# Patient Record
Sex: Male | Born: 1945 | Race: White | Hispanic: No | Marital: Married | Smoking: Never smoker
Health system: Southern US, Community
[De-identification: ages and names within clinical notes are randomized; demographics above are authoritative.]

## PROBLEM LIST (undated history)

## (undated) DIAGNOSIS — M109 Gout, unspecified: Secondary | ICD-10-CM

## (undated) DIAGNOSIS — F4310 Post-traumatic stress disorder, unspecified: Secondary | ICD-10-CM

## (undated) DIAGNOSIS — F419 Anxiety disorder, unspecified: Secondary | ICD-10-CM

## (undated) DIAGNOSIS — K219 Gastro-esophageal reflux disease without esophagitis: Secondary | ICD-10-CM

## (undated) DIAGNOSIS — F410 Panic disorder [episodic paroxysmal anxiety] without agoraphobia: Secondary | ICD-10-CM

## (undated) DIAGNOSIS — F32A Depression, unspecified: Secondary | ICD-10-CM

## (undated) DIAGNOSIS — IMO0001 Reserved for inherently not codable concepts without codable children: Secondary | ICD-10-CM

## (undated) DIAGNOSIS — F329 Major depressive disorder, single episode, unspecified: Secondary | ICD-10-CM

## (undated) HISTORY — PX: TONSILLECTOMY AND ADENOIDECTOMY: SHX28

## (undated) HISTORY — DX: Major depressive disorder, single episode, unspecified: F32.9

## (undated) HISTORY — PX: APPENDECTOMY: SHX54

## (undated) HISTORY — DX: Post-traumatic stress disorder, unspecified: F43.10

## (undated) HISTORY — PX: HERNIA REPAIR: SHX51

## (undated) HISTORY — PX: OTHER SURGICAL HISTORY: SHX169

## (undated) HISTORY — DX: Gastro-esophageal reflux disease without esophagitis: K21.9

## (undated) HISTORY — DX: Anxiety disorder, unspecified: F41.9

## (undated) HISTORY — DX: Depression, unspecified: F32.A

## (undated) HISTORY — DX: Reserved for inherently not codable concepts without codable children: IMO0001

## (undated) HISTORY — PX: VASECTOMY: SHX75

## (undated) HISTORY — PX: NOSE SURGERY: SHX723

## (undated) HISTORY — DX: Panic disorder (episodic paroxysmal anxiety): F41.0

---

## 1967-05-20 HISTORY — PX: OTHER SURGICAL HISTORY: SHX169

## 1999-08-31 ENCOUNTER — Encounter: Payer: Self-pay | Admitting: Emergency Medicine

## 1999-08-31 ENCOUNTER — Emergency Department (HOSPITAL_COMMUNITY): Admission: EM | Admit: 1999-08-31 | Discharge: 1999-08-31 | Payer: Self-pay | Admitting: Emergency Medicine

## 2002-05-24 ENCOUNTER — Emergency Department (HOSPITAL_COMMUNITY): Admission: EM | Admit: 2002-05-24 | Discharge: 2002-05-24 | Payer: Self-pay | Admitting: Emergency Medicine

## 2005-03-19 ENCOUNTER — Ambulatory Visit (HOSPITAL_COMMUNITY): Admission: RE | Admit: 2005-03-19 | Discharge: 2005-03-19 | Payer: Self-pay | Admitting: Family Medicine

## 2005-04-11 ENCOUNTER — Ambulatory Visit (HOSPITAL_COMMUNITY): Admission: RE | Admit: 2005-04-11 | Discharge: 2005-04-11 | Payer: Self-pay | Admitting: Family Medicine

## 2005-05-23 ENCOUNTER — Ambulatory Visit (HOSPITAL_COMMUNITY): Admission: RE | Admit: 2005-05-23 | Discharge: 2005-05-23 | Payer: Self-pay | Admitting: Family Medicine

## 2007-02-08 ENCOUNTER — Ambulatory Visit (HOSPITAL_COMMUNITY): Admission: RE | Admit: 2007-02-08 | Discharge: 2007-02-08 | Payer: Self-pay | Admitting: Family Medicine

## 2007-10-27 ENCOUNTER — Ambulatory Visit (HOSPITAL_COMMUNITY): Admission: RE | Admit: 2007-10-27 | Discharge: 2007-10-27 | Payer: Self-pay | Admitting: Family Medicine

## 2008-01-18 ENCOUNTER — Ambulatory Visit (HOSPITAL_COMMUNITY): Admission: RE | Admit: 2008-01-18 | Discharge: 2008-01-18 | Payer: Self-pay | Admitting: Family Medicine

## 2008-01-31 ENCOUNTER — Emergency Department (HOSPITAL_COMMUNITY): Admission: EM | Admit: 2008-01-31 | Discharge: 2008-01-31 | Payer: Self-pay | Admitting: Emergency Medicine

## 2008-02-17 ENCOUNTER — Ambulatory Visit (HOSPITAL_COMMUNITY): Admission: RE | Admit: 2008-02-17 | Discharge: 2008-02-17 | Payer: Self-pay | Admitting: Family Medicine

## 2008-03-16 ENCOUNTER — Ambulatory Visit (HOSPITAL_COMMUNITY): Admission: RE | Admit: 2008-03-16 | Discharge: 2008-03-16 | Payer: Self-pay | Admitting: Internal Medicine

## 2008-06-20 ENCOUNTER — Emergency Department (HOSPITAL_COMMUNITY): Admission: EM | Admit: 2008-06-20 | Discharge: 2008-06-20 | Payer: Self-pay | Admitting: Emergency Medicine

## 2009-05-03 ENCOUNTER — Encounter: Payer: Self-pay | Admitting: Orthopedic Surgery

## 2009-05-03 ENCOUNTER — Ambulatory Visit (HOSPITAL_COMMUNITY): Admission: RE | Admit: 2009-05-03 | Discharge: 2009-05-03 | Payer: Self-pay | Admitting: Family Medicine

## 2009-05-17 ENCOUNTER — Ambulatory Visit: Payer: Self-pay | Admitting: Orthopedic Surgery

## 2009-05-17 DIAGNOSIS — S62639A Displaced fracture of distal phalanx of unspecified finger, initial encounter for closed fracture: Secondary | ICD-10-CM

## 2009-06-14 ENCOUNTER — Ambulatory Visit: Payer: Self-pay | Admitting: Orthopedic Surgery

## 2009-12-25 ENCOUNTER — Ambulatory Visit (HOSPITAL_COMMUNITY): Admission: RE | Admit: 2009-12-25 | Discharge: 2009-12-25 | Payer: Self-pay | Admitting: Internal Medicine

## 2010-03-28 ENCOUNTER — Ambulatory Visit: Payer: Self-pay | Admitting: Orthopedic Surgery

## 2010-03-28 DIAGNOSIS — M1710 Unilateral primary osteoarthritis, unspecified knee: Secondary | ICD-10-CM

## 2010-03-28 DIAGNOSIS — M25469 Effusion, unspecified knee: Secondary | ICD-10-CM | POA: Insufficient documentation

## 2010-06-17 ENCOUNTER — Emergency Department (HOSPITAL_COMMUNITY)
Admission: EM | Admit: 2010-06-17 | Discharge: 2010-06-17 | Payer: Self-pay | Source: Home / Self Care | Admitting: Emergency Medicine

## 2010-06-17 ENCOUNTER — Encounter: Payer: Self-pay | Admitting: Orthopedic Surgery

## 2010-06-18 ENCOUNTER — Emergency Department (HOSPITAL_COMMUNITY)
Admission: EM | Admit: 2010-06-18 | Discharge: 2010-06-18 | Payer: Self-pay | Source: Home / Self Care | Admitting: Emergency Medicine

## 2010-06-18 NOTE — Assessment & Plan Note (Signed)
Summary: 4 WK RE-CK/XRAY THUMB/MAILHANDL/CAF   Visit Type:  Follow-up Referring Provider:  Dr. Tawanna Cooler so Primary Provider:  Dr. presents  CC:  left thumb fracture.  History of Present Illness: fracture care   65 year old male LEFT thumb fracture distal phalanx comminuted followup for x-rays  Doing well has returned to full motion.  He has some subungual old blood which will resolve  X-rays show comminuted fracture and stable alignment  Impression improved followup as needed Meds: Citalopram, Clonazepam, Omeprazole, Ibuprofen, Trazadone, Neurontin.  Allergies: No Known Drug Allergies   Impression & Recommendations:  Problem # 1:  CLOSED FRACTURE DISTAL PHALANX OR PHALANGES HAND (ICD-816.02) Assessment Improved  x-ray [facility] lateral oblique distal phalanx LEFT thumb  Findings comminuted fracture with normal alignment  Impression healing fracture normal alignment most likely was a fibrous union  Orders: Post-Op Check (16109) Finger(s) x-ray, minimum 2 views (60454)  Patient Instructions: 1)  Please schedule a follow-up appointment as needed.
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
## 2010-06-18 NOTE — Letter (Signed)
Summary: History form  History form   Imported By: Jacklynn Ganong 05/25/2009 08:54:24  _____________________________________________________________________  External Attachment:    Type:   Image     Comment:   External Document

## 2010-06-20 ENCOUNTER — Encounter: Payer: Self-pay | Admitting: Orthopedic Surgery

## 2010-06-20 ENCOUNTER — Ambulatory Visit (INDEPENDENT_AMBULATORY_CARE_PROVIDER_SITE_OTHER): Payer: No Typology Code available for payment source | Admitting: Orthopedic Surgery

## 2010-06-20 DIAGNOSIS — S61209A Unspecified open wound of unspecified finger without damage to nail, initial encounter: Secondary | ICD-10-CM

## 2010-06-21 ENCOUNTER — Ambulatory Visit (HOSPITAL_COMMUNITY)
Admission: RE | Admit: 2010-06-21 | Discharge: 2010-06-21 | Disposition: A | Discharge status: Home / Self Care | Payer: No Typology Code available for payment source | Source: Ambulatory Visit | Attending: Family Medicine | Admitting: Family Medicine

## 2010-06-21 ENCOUNTER — Telehealth: Payer: Self-pay | Admitting: Orthopedic Surgery

## 2010-06-21 ENCOUNTER — Ambulatory Visit (HOSPITAL_COMMUNITY): Payer: Self-pay | Admitting: Physical Therapy

## 2010-06-21 DIAGNOSIS — S61209A Unspecified open wound of unspecified finger without damage to nail, initial encounter: Secondary | ICD-10-CM | POA: Insufficient documentation

## 2010-06-21 DIAGNOSIS — IMO0001 Reserved for inherently not codable concepts without codable children: Secondary | ICD-10-CM | POA: Insufficient documentation

## 2010-06-21 NOTE — Letter (Signed)
Summary: History form  History form   Imported By: Jacklynn Ganong 03/28/2010 15:18:59  _____________________________________________________________________  External Attachment:    Type:   Image     Comment:   External Document

## 2010-06-24 ENCOUNTER — Ambulatory Visit (HOSPITAL_COMMUNITY)
Admission: RE | Admit: 2010-06-24 | Discharge: 2010-06-24 | Disposition: A | Discharge status: Home / Self Care | Payer: No Typology Code available for payment source | Source: Ambulatory Visit | Attending: Family Medicine | Admitting: Family Medicine

## 2010-06-24 DIAGNOSIS — S62639A Displaced fracture of distal phalanx of unspecified finger, initial encounter for closed fracture: Secondary | ICD-10-CM | POA: Insufficient documentation

## 2010-06-26 ENCOUNTER — Ambulatory Visit (HOSPITAL_COMMUNITY)
Admission: RE | Admit: 2010-06-26 | Discharge: 2010-06-26 | Disposition: A | Discharge status: Home / Self Care | Payer: No Typology Code available for payment source | Source: Ambulatory Visit | Attending: Family Medicine | Admitting: Family Medicine

## 2010-06-26 DIAGNOSIS — S61209A Unspecified open wound of unspecified finger without damage to nail, initial encounter: Secondary | ICD-10-CM | POA: Insufficient documentation

## 2010-06-26 DIAGNOSIS — IMO0001 Reserved for inherently not codable concepts without codable children: Secondary | ICD-10-CM | POA: Insufficient documentation
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
## 2010-06-26 NOTE — Miscellaneous (Signed)
  Clinical Lists Changes  Medications: Added new medication of PERCOCET 7.5-325 MG TABS (OXYCODONE-ACETAMINOPHEN) 1 q 4 - Signed Rx of PERCOCET 7.5-325 MG TABS (OXYCODONE-ACETAMINOPHEN) 1 q 4;  #42 x 0;  Signed;  Entered by: Fuller Canada MD;  Authorized by: Fuller Canada MD;  Method used: Handwritten    Prescriptions: PERCOCET 7.5-325 MG TABS (OXYCODONE-ACETAMINOPHEN) 1 q 4  #42 x 0   Entered and Authorized by:   Fuller Canada MD   Signed by:   Fuller Canada MD on 06/20/2010   Method used:   Handwritten   RxID:   [id number]

## 2010-06-27 ENCOUNTER — Encounter: Payer: Self-pay | Admitting: Orthopedic Surgery

## 2010-06-27 ENCOUNTER — Ambulatory Visit (INDEPENDENT_AMBULATORY_CARE_PROVIDER_SITE_OTHER): Payer: No Typology Code available for payment source | Admitting: Orthopedic Surgery

## 2010-06-27 DIAGNOSIS — S61209A Unspecified open wound of unspecified finger without damage to nail, initial encounter: Secondary | ICD-10-CM

## 2010-06-28 ENCOUNTER — Ambulatory Visit (HOSPITAL_COMMUNITY)
Admission: RE | Admit: 2010-06-28 | Discharge: 2010-06-28 | Disposition: A | Discharge status: Home / Self Care | Payer: No Typology Code available for payment source | Source: Ambulatory Visit | Attending: Physical Therapy | Admitting: Physical Therapy

## 2010-07-01 ENCOUNTER — Ambulatory Visit (HOSPITAL_COMMUNITY)
Admission: RE | Admit: 2010-07-01 | Discharge: 2010-07-01 | Disposition: A | Discharge status: Home / Self Care | Payer: No Typology Code available for payment source | Source: Ambulatory Visit | Attending: Physical Therapy | Admitting: Physical Therapy

## 2010-07-03 ENCOUNTER — Ambulatory Visit (HOSPITAL_COMMUNITY)
Admission: RE | Admit: 2010-07-03 | Discharge: 2010-07-03 | Disposition: A | Discharge status: Home / Self Care | Payer: No Typology Code available for payment source | Source: Ambulatory Visit | Attending: *Deleted | Admitting: *Deleted

## 2010-07-04 NOTE — Assessment & Plan Note (Signed)
Summary: 1 wk reck finger/mailhandlers/bsf   Visit Type:  Follow-up Referring Provider:  self Primary Provider:  [name]  CC:  recheck finger.  History of Present Illness: I saw Jesse Yu in the office today for a new problem visit.  He is a 65 years old man with the complaint of:  right middle finger injury.  06/17/10 DOI caught finger in a pulley.  06/17/10 finger xray [facility].  Meds: Clonazepam, Trazodone, Omeprazole, Citalopram, Niacin, ASA, Ibuprofen 800mg   Percocet 7.5 from our office and Doxycycline 100mg  two times a day x 10 days given from er.  Today is 1 week recheck on finger after wound care every other day.  Doing better.  Pain level today is 6 with medication.        Allergies: No Known Drug Allergies  Physical Exam  Additional Exam:  clean wound bed  redressed    Other Orders: Est. Patient Level I (16109)  Patient Instructions: 1)  Return in 2 week  2)  continue dressing changes  Prescriptions: PERCOCET 7.5-325 MG TABS (OXYCODONE-ACETAMINOPHEN) 1 q 4  #84 x 0   Entered and Authorized by:   Fuller Canada MD   Signed by:   Fuller Canada MD on 06/27/2010   Method used:   Print then Give to Patient   RxID:   [id number]    Orders Added: 1)  Est. Patient Level I [95621]

## 2010-07-04 NOTE — Progress Notes (Signed)
Summary: physical therapy order verificiation about splint  Phone Note Call from Patient   Caller: Patient Summary of Call: Donnamae Jude from physical therapy, ph [phone], called fol'g seeing patient today. Verifying that Dr Romeo Apple did not intend for them to make a new splint. Order states "wrap and splinting"; just making sure.  Patient's next visit at therapy is Mon, 06/24/10 at 8:00am  Initial call taken by: Cammie Sickle,  June 21, 2010 11:58 AM

## 2010-07-05 ENCOUNTER — Ambulatory Visit (HOSPITAL_COMMUNITY)
Admission: RE | Admit: 2010-07-05 | Discharge: 2010-07-05 | Disposition: A | Discharge status: Home / Self Care | Payer: No Typology Code available for payment source | Source: Ambulatory Visit | Attending: *Deleted | Admitting: *Deleted

## 2010-07-08 ENCOUNTER — Ambulatory Visit (HOSPITAL_COMMUNITY)
Admission: RE | Admit: 2010-07-08 | Discharge: 2010-07-08 | Disposition: A | Discharge status: Home / Self Care | Payer: No Typology Code available for payment source | Source: Ambulatory Visit | Attending: *Deleted | Admitting: *Deleted

## 2010-07-10 ENCOUNTER — Ambulatory Visit (HOSPITAL_COMMUNITY)
Admission: RE | Admit: 2010-07-10 | Discharge: 2010-07-10 | Disposition: A | Discharge status: Home / Self Care | Payer: No Typology Code available for payment source | Source: Ambulatory Visit | Attending: *Deleted | Admitting: *Deleted

## 2010-07-10 NOTE — Miscellaneous (Signed)
Summary: PT/Wound evaluation  PT/Wound evaluation   Imported By: Jacklynn Ganong 07/03/2010 12:21:50  _____________________________________________________________________  External Attachment:    Type:   Image     Comment:   External Document

## 2010-07-11 ENCOUNTER — Encounter: Payer: Self-pay | Admitting: Orthopedic Surgery

## 2010-07-11 ENCOUNTER — Ambulatory Visit (INDEPENDENT_AMBULATORY_CARE_PROVIDER_SITE_OTHER): Payer: No Typology Code available for payment source | Admitting: Orthopedic Surgery

## 2010-07-11 DIAGNOSIS — S61209A Unspecified open wound of unspecified finger without damage to nail, initial encounter: Secondary | ICD-10-CM

## 2010-07-12 ENCOUNTER — Ambulatory Visit (HOSPITAL_COMMUNITY)
Admission: RE | Admit: 2010-07-12 | Discharge: 2010-07-12 | Disposition: A | Discharge status: Home / Self Care | Payer: No Typology Code available for payment source | Source: Ambulatory Visit | Attending: *Deleted | Admitting: *Deleted

## 2010-07-15 ENCOUNTER — Ambulatory Visit (HOSPITAL_COMMUNITY)
Admission: RE | Admit: 2010-07-15 | Discharge: 2010-07-15 | Disposition: A | Discharge status: Home / Self Care | Payer: No Typology Code available for payment source | Source: Ambulatory Visit | Attending: *Deleted | Admitting: *Deleted

## 2010-07-16 NOTE — Letter (Signed)
Summary: History form  History form   Imported By: Jacklynn Ganong 07/09/2010 14:33:26  _____________________________________________________________________  External Attachment:    Type:   Image     Comment:   External Document

## 2010-07-16 NOTE — Assessment & Plan Note (Signed)
Summary: 2 wk RE-CK/DRESSING/RT MIDDLE FINGER/Mailhandlers/CAF   Visit Type:  Follow-up Referring Provider:  self Primary Provider:  [name]  CC:  recheck finger dressing change.  History of Present Illness: I saw Jesse Yu in the office today for a new problem visit.  He is a 65 years old man with the complaint of:  right middle finger injury.  06/17/10 DOI caught finger in a pulley. 06/17/10 finger xray [facility].  Meds: Clonazepam, Trazodone, Omeprazole, Citalopram, Niacin, ASA, Ibuprofen 800mg  Percocet 7.5 from our office for pain, needs refill.  Today is 2 week recheck on finger after wound care every other day.  Pain level is 0, is anxious.           Allergies: No Known Drug Allergies  Physical Exam  Additional Exam:  RIGHT long finger.  The finger looks good. The wound is closing has a little bit of a flexion contracture   Impression & Recommendations:  Problem # 1:  OPEN WOUND FINGER WITHOUT MENTION COMPLICATION (ICD-883.0) Assessment Improved  Orders: Est. Patient Level I (16109)  Medications Added to Medication List This Visit: 1)  Norco 10-325 Mg Tabs (Hydrocodone-acetaminophen) .Marland Kitchen.. 1 by mouth q 4 prn pain  Patient Instructions: 1)  4 weeks  Prescriptions: NORCO 10-325 MG TABS (HYDROCODONE-ACETAMINOPHEN) 1 by mouth q 4 prn pain  #84 x 1   Entered and Authorized by:   Fuller Canada MD   Signed by:   Fuller Canada MD on 07/11/2010   Method used:   Print then Give to Patient   RxID:   [id number]    Orders Added: 1)  Est. Patient Level I [95621]

## 2010-07-17 ENCOUNTER — Ambulatory Visit (HOSPITAL_COMMUNITY)
Admission: RE | Admit: 2010-07-17 | Discharge: 2010-07-17 | Disposition: A | Discharge status: Home / Self Care | Payer: No Typology Code available for payment source | Source: Ambulatory Visit | Attending: *Deleted | Admitting: *Deleted

## 2010-07-19 ENCOUNTER — Encounter: Payer: Self-pay | Admitting: Orthopedic Surgery

## 2010-07-19 ENCOUNTER — Ambulatory Visit (HOSPITAL_COMMUNITY)
Admission: RE | Admit: 2010-07-19 | Discharge: 2010-07-19 | Disposition: A | Discharge status: Home / Self Care | Payer: No Typology Code available for payment source | Source: Ambulatory Visit | Attending: Family Medicine | Admitting: Family Medicine

## 2010-07-19 ENCOUNTER — Telehealth: Payer: Self-pay | Admitting: Orthopedic Surgery

## 2010-07-19 DIAGNOSIS — S62639A Displaced fracture of distal phalanx of unspecified finger, initial encounter for closed fracture: Secondary | ICD-10-CM | POA: Insufficient documentation

## 2010-07-22 ENCOUNTER — Ambulatory Visit (HOSPITAL_COMMUNITY): Payer: Self-pay | Admitting: Physical Therapy

## 2010-07-24 ENCOUNTER — Ambulatory Visit (HOSPITAL_COMMUNITY): Payer: Self-pay | Admitting: Physical Therapy

## 2010-07-25 NOTE — Miscellaneous (Signed)
Summary: PT/Wound care order  PT/Wound care order   Imported By: Cammie Sickle 07/16/2010 11:37:42  _____________________________________________________________________  External Attachment:    Type:   Image     Comment:   External Document

## 2010-07-26 ENCOUNTER — Ambulatory Visit (HOSPITAL_COMMUNITY): Payer: Self-pay | Admitting: Physical Therapy

## 2010-07-29 ENCOUNTER — Encounter: Payer: Self-pay | Admitting: Orthopedic Surgery

## 2010-07-29 ENCOUNTER — Ambulatory Visit (HOSPITAL_COMMUNITY): Payer: Self-pay | Admitting: Physical Therapy

## 2010-07-30 NOTE — Progress Notes (Signed)
Summary: patient question fol'g completing wound care treatment  Phone Note Call from Patient   Caller: Patient Summary of Call: Patient stopped in this morning to relay that he has "finished" his wound care at [facility].  He requested to cancel his upcoming fol/up appointment.  I have advised to keep the appointment in our office as scheduled.  Report not yet received from [facility]    If any other recommendations, please advise. Initial call taken by: Cammie Sickle,  July 19, 2010 11:49 AM

## 2010-07-31 ENCOUNTER — Ambulatory Visit (HOSPITAL_COMMUNITY): Payer: Self-pay | Admitting: Physical Therapy

## 2010-08-02 ENCOUNTER — Ambulatory Visit (HOSPITAL_COMMUNITY): Payer: Self-pay | Admitting: Physical Therapy

## 2010-08-05 ENCOUNTER — Encounter: Payer: Self-pay | Admitting: Orthopedic Surgery

## 2010-08-08 ENCOUNTER — Ambulatory Visit: Payer: Self-pay | Admitting: Orthopedic Surgery

## 2010-12-12 ENCOUNTER — Encounter: Payer: Self-pay | Admitting: Orthopedic Surgery

## 2010-12-12 ENCOUNTER — Ambulatory Visit (INDEPENDENT_AMBULATORY_CARE_PROVIDER_SITE_OTHER): Payer: No Typology Code available for payment source | Admitting: Orthopedic Surgery

## 2010-12-12 DIAGNOSIS — M25539 Pain in unspecified wrist: Secondary | ICD-10-CM

## 2010-12-12 NOTE — Progress Notes (Signed)
Chief complaint wrist pain bilateral  65 year old male  Bilateral wrist pain after putting up a fax and doing some shoveling.  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  When he is not doing this no pain.  Previous to carpal tunnel releases done elsewhere  Review of systems no numbness or tingling related to this he has some anxiety and depression he denies shortness of breath heartburn or frequency  Vital signs are stable as recorded  General appearance is normal  The patient is alert and oriented x3  The patient's mood and affect are normal  Gait assessment: Normal The cardiovascular exam reveals normal pulses and temperature without edema swelling.  The lymphatic system is negative for palpable lymph nodes  The sensory exam is normal.  There are no pathologic reflexes.  Balance is normal.   Exam of the Both wrists Inspection No tenderness no swelling, range of motion normal.  Excellent grip strength.  Watson click test negative.  Skin intact.  Normal.   Wrist pain secondary to stress and increase activity  Recommended wrist sleeves or avoid the offending activity  Patient comfortable with this patient

## 2010-12-12 NOTE — Patient Instructions (Signed)
Get wrist sleeve and use with working or when pain flares up.  Come back as needed

## 2011-01-03 ENCOUNTER — Inpatient Hospital Stay (HOSPITAL_COMMUNITY)
Admission: EM | Admit: 2011-01-03 | Discharge: 2011-01-04 | Disposition: A | Discharge status: Other Acute Inpatient Hospital | Payer: No Typology Code available for payment source | Source: Home / Self Care

## 2011-01-03 ENCOUNTER — Encounter (HOSPITAL_COMMUNITY): Payer: Self-pay | Admitting: Emergency Medicine

## 2011-01-03 DIAGNOSIS — W5911XA Bitten by nonvenomous snake, initial encounter: Secondary | ICD-10-CM

## 2011-01-03 DIAGNOSIS — M109 Gout, unspecified: Secondary | ICD-10-CM | POA: Diagnosis present

## 2011-01-03 DIAGNOSIS — M25539 Pain in unspecified wrist: Secondary | ICD-10-CM | POA: Diagnosis present

## 2011-01-03 DIAGNOSIS — R51 Headache: Secondary | ICD-10-CM | POA: Diagnosis present

## 2011-01-03 DIAGNOSIS — T6391XA Toxic effect of contact with unspecified venomous animal, accidental (unintentional), initial encounter: Principal | ICD-10-CM | POA: Diagnosis present

## 2011-01-03 DIAGNOSIS — T63001A Toxic effect of unspecified snake venom, accidental (unintentional), initial encounter: Secondary | ICD-10-CM | POA: Diagnosis present

## 2011-01-03 DIAGNOSIS — F341 Dysthymic disorder: Secondary | ICD-10-CM | POA: Diagnosis present

## 2011-01-03 DIAGNOSIS — F4310 Post-traumatic stress disorder, unspecified: Secondary | ICD-10-CM | POA: Diagnosis present

## 2011-01-03 LAB — COMPREHENSIVE METABOLIC PANEL
ALT: 22 U/L (ref 0–53)
Albumin: 3.7 g/dL (ref 3.5–5.2)
BUN: 16 mg/dL (ref 6–23)
Chloride: 102 mEq/L (ref 96–112)
Creatinine, Ser: 1.02 mg/dL (ref 0.50–1.35)
GFR calc non Af Amer: >60 mL/min (ref >60)
Glucose, Bld: 111 mg/dL — ABNORMAL HIGH (ref 70–99)
Sodium: 137 mEq/L (ref 135–145)
Total Bilirubin: 0.2 mg/dL — ABNORMAL LOW (ref 0.3–1.2)

## 2011-01-03 LAB — CBC
HCT: 42.9 % (ref 39.0–52.0)
Hemoglobin: 14.7 g/dL (ref 13.0–17.0)
MCH: 31.3 pg (ref 26.0–34.0)
MCHC: 34.3 g/dL (ref 30.0–36.0)
RDW: 14.1 % (ref 11.5–15.5)

## 2011-01-03 LAB — DIFFERENTIAL: Basophils Absolute: 0.1 10*3/uL (ref 0.0–0.1)

## 2011-01-03 LAB — PROTIME-INR: Prothrombin Time: 13 seconds (ref 11.6–15.2)

## 2011-01-03 MED ORDER — SODIUM CHLORIDE 0.9 % IV SOLN
6.0000 | Freq: Once | INTRAVENOUS | Status: AC
  Administered 2011-01-03: 60 mL via INTRAVENOUS
  Filled 2011-01-03: qty 60
  Filled 2011-01-03: qty 40

## 2011-01-03 MED ORDER — ONDANSETRON HCL 4 MG/2ML IJ SOLN
4.0000 mg | Freq: Once | INTRAMUSCULAR | Status: AC
  Administered 2011-01-03: 4 mg via INTRAVENOUS
  Filled 2011-01-03: qty 2

## 2011-01-03 MED ORDER — ONDANSETRON HCL 4 MG/2ML IJ SOLN
INTRAMUSCULAR | Status: AC
  Filled 2011-01-03: qty 2

## 2011-01-03 MED ORDER — SODIUM CHLORIDE 0.9 % IV BOLUS (SEPSIS)
1000.0000 mL | Freq: Once | INTRAVENOUS | Status: AC
  Administered 2011-01-03: 1000 mL via INTRAVENOUS

## 2011-01-03 MED ORDER — HYDROMORPHONE HCL 2 MG/ML IJ SOLN: 2.0000 mg | Freq: Once | INTRAMUSCULAR | Status: DC

## 2011-01-03 MED ORDER — ONDANSETRON HCL 4 MG/2ML IJ SOLN
INTRAMUSCULAR | Status: AC
  Administered 2011-01-03: 4 mg
  Filled 2011-01-03: qty 2

## 2011-01-03 MED ORDER — HYDROMORPHONE HCL 1 MG/ML IJ SOLN
INTRAMUSCULAR | Status: AC
  Filled 2011-01-03: qty 1

## 2011-01-03 MED ORDER — SODIUM CHLORIDE 0.9 % IV SOLN: Freq: Once | INTRAVENOUS | Status: DC

## 2011-01-03 MED ORDER — ONDANSETRON HCL 4 MG/2ML IJ SOLN: 4.0000 mg | Freq: Once | INTRAMUSCULAR | Status: DC

## 2011-01-03 MED ORDER — HYDROMORPHONE HCL 2 MG/ML IJ SOLN
INTRAMUSCULAR | Status: AC
  Administered 2011-01-03: 2 mg
  Filled 2011-01-03: qty 1

## 2011-01-03 MED ORDER — HYDROMORPHONE HCL 1 MG/ML IJ SOLN
1.0000 mg | Freq: Once | INTRAMUSCULAR | Status: AC
  Administered 2011-01-03: 1 mg via INTRAVENOUS
  Filled 2011-01-03: qty 1

## 2011-01-03 MED ORDER — CROTALIDAE POLYVAL IMMUNE FAB IV SOLR
INTRAVENOUS | Status: AC
  Filled 2011-01-03: qty 10

## 2011-01-03 MED ORDER — HYDROMORPHONE HCL 1 MG/ML PO LIQD: 1.5000 mg | ORAL | Status: DC | PRN

## 2011-01-03 MED ORDER — HYDROMORPHONE HCL 4 MG/ML IJ SOLN: 4.0000 mg | Freq: Once | INTRAMUSCULAR | Status: DC

## 2011-01-03 NOTE — ED Notes (Signed)
Pain lt hand and forearm, painful,  Good cap refill

## 2011-01-03 NOTE — ED Notes (Signed)
crofab started at 77ml/hr for first per instructions.

## 2011-01-03 NOTE — ED Notes (Signed)
crofab-each vial mixed with 10ml of sterile water then all 6 vials put in 250NS -started at 610pm. Swelling has now extended to wrist area. Pt still c/o pain. In process of 2nd iv site to give pain/nausea meds. Pt slightly more pale to face than when arrived. Pt only c/o now is still just pain. Was advised upon arrival to hold hand down as low as possible.

## 2011-01-03 NOTE — ED Notes (Signed)
Swelling to elbow LT,  Good cap refill.  No distress. Called report

## 2011-01-03 NOTE — ED Notes (Signed)
Swelling present.  Good cap refilll

## 2011-01-03 NOTE — ED Notes (Signed)
Pt bit by [place] copper head snake, has snake head with him. Pt is alert/oriented. Pt slightly diaphoretic. Bit to L hand. Denies sx's at this time.  L posterior hand swelling noted. Middle L finger moderately swollen also. EDp aware of pt.

## 2011-01-03 NOTE — ED Notes (Signed)
Alert, sl drowsy, color good.  Skin warm and dry, sinus rhythm.  Swelling of rt hand  With pain.
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
## 2011-01-04 ENCOUNTER — Inpatient Hospital Stay (HOSPITAL_COMMUNITY)
Admission: AD | Admit: 2011-01-04 | Discharge: 2011-01-07 | DRG: 918 | Disposition: A | Discharge status: Home / Self Care | Payer: No Typology Code available for payment source | Source: Other Acute Inpatient Hospital | Attending: Internal Medicine | Admitting: Internal Medicine

## 2011-01-04 LAB — CBC
Hemoglobin: 14.3 g/dL (ref 13.0–17.0)
Platelets: 198 10*3/uL (ref 150–400)
RBC: 4.49 MIL/uL (ref 4.22–5.81)
WBC: 9.2 10*3/uL (ref 4.0–10.5)

## 2011-01-04 LAB — BASIC METABOLIC PANEL
BUN: 15 mg/dL (ref 6–23)
Calcium: 8.1 mg/dL — ABNORMAL LOW (ref 8.4–10.5)
Creatinine, Ser: 1.05 mg/dL (ref 0.50–1.35)
GFR calc Af Amer: >60 mL/min (ref >60)
GFR calc non Af Amer: >60 mL/min (ref >60)

## 2011-01-04 LAB — APTT: aPTT: 29 seconds (ref 24–37)

## 2011-01-04 LAB — D-DIMER, QUANTITATIVE: D-Dimer, Quant: 0.83 ug/mL-FEU — ABNORMAL HIGH (ref 0.00–0.48)

## 2011-01-04 LAB — PROTIME-INR: INR: 1.06 (ref 0.00–1.49)

## 2011-01-04 LAB — FIBRINOGEN: Fibrinogen: 235 mg/dL (ref 204–475)

## 2011-01-04 MED ORDER — SODIUM CHLORIDE 0.9 % IV SOLN: INTRAVENOUS | Status: DC

## 2011-01-04 MED ORDER — HYDROMORPHONE HCL 1 MG/ML IJ SOLN
1.5000 mg | Freq: Once | INTRAMUSCULAR | Status: AC
  Administered 2011-01-04: 1.5 mg via INTRAVENOUS

## 2011-01-04 MED ORDER — HYDROMORPHONE HCL 1 MG/ML IJ SOLN
INTRAMUSCULAR | Status: AC
  Administered 2011-01-04: 1.5 mg via INTRAVENOUS
  Filled 2011-01-04: qty 2

## 2011-01-04 NOTE — Progress Notes (Signed)
Initial skin assessment verified by T. Carola Frost, RN

## 2011-01-04 NOTE — Progress Notes (Signed)
Report given to [facility]. Informed that service was to arrive within approximately 25 mins. Will inform pt of transfer.

## 2011-01-04 NOTE — Progress Notes (Signed)
Pt left via [facility]. No complaints at this time. Pain controled. O2 applied for sats of 89% before transfer. V/S 98.3 95 16 118/84. Called report to [facility] at 3700. Informed him of pts condition and orders.

## 2011-01-05 ENCOUNTER — Inpatient Hospital Stay (HOSPITAL_COMMUNITY): Payer: No Typology Code available for payment source

## 2011-01-05 LAB — CBC
HCT: 38.9 % — ABNORMAL LOW (ref 39.0–52.0)
MCHC: 33.4 g/dL (ref 30.0–36.0)
Platelets: 180 10*3/uL (ref 150–400)
RDW: 14.4 % (ref 11.5–15.5)

## 2011-01-05 LAB — BASIC METABOLIC PANEL
BUN: 14 mg/dL (ref 6–23)
GFR calc non Af Amer: >60 mL/min (ref >60)
Glucose, Bld: 110 mg/dL — ABNORMAL HIGH (ref 70–99)
Potassium: 3.8 mEq/L (ref 3.5–5.1)

## 2011-01-05 LAB — DIFFERENTIAL
Basophils Absolute: 0 10*3/uL (ref 0.0–0.1)
Basophils Relative: 1 % (ref 0–1)
Eosinophils Absolute: 0.2 10*3/uL (ref 0.0–0.7)
Eosinophils Relative: 3 % (ref 0–5)
Monocytes Absolute: 0.6 10*3/uL (ref 0.1–1.0)

## 2011-01-05 LAB — FIBRINOGEN: Fibrinogen: 282 mg/dL (ref 204–475)

## 2011-01-05 LAB — PROTIME-INR: Prothrombin Time: 13.3 seconds (ref 11.6–15.2)

## 2011-01-06 ENCOUNTER — Inpatient Hospital Stay (HOSPITAL_COMMUNITY): Payer: No Typology Code available for payment source

## 2011-01-06 LAB — CBC
HCT: 40.1 % (ref 39.0–52.0)
Hemoglobin: 13.7 g/dL (ref 13.0–17.0)
MCH: 31.4 pg (ref 26.0–34.0)
MCHC: 34.2 g/dL (ref 30.0–36.0)

## 2011-01-06 LAB — SYNOVIAL CELL COUNT + DIFF, W/ CRYSTALS: Lymphocytes-Synovial Fld: 1 % (ref 0–20)

## 2011-01-06 LAB — BASIC METABOLIC PANEL
BUN: 13 mg/dL (ref 6–23)
CO2: 30 mEq/L (ref 19–32)
GFR calc non Af Amer: >60 mL/min (ref >60)
Glucose, Bld: 115 mg/dL — ABNORMAL HIGH (ref 70–99)
Potassium: 3.8 mEq/L (ref 3.5–5.1)

## 2011-01-06 LAB — GRAM STAIN

## 2011-01-06 NOTE — ED Notes (Signed)
I personally performed the services described in this documentation, which was scribed in my presence. The recorded information has been reviewed and considered. No att. providers found   Donnetta Hutching, MD 01/06/11 [phone]

## 2011-01-07 LAB — BASIC METABOLIC PANEL
CO2: 28 mEq/L (ref 19–32)
Chloride: 102 mEq/L (ref 96–112)
GFR calc Af Amer: >60 mL/min (ref >60)
Potassium: 4.3 mEq/L (ref 3.5–5.1)
Sodium: 138 mEq/L (ref 135–145)

## 2011-01-07 LAB — CBC
MCV: 90.2 fL (ref 78.0–100.0)
Platelets: 183 10*3/uL (ref 150–400)
RBC: 4.39 MIL/uL (ref 4.22–5.81)
RDW: 13.5 % (ref 11.5–15.5)
WBC: 8.2 10*3/uL (ref 4.0–10.5)

## 2011-01-07 NOTE — ED Notes (Signed)
I personally performed the services described in this documentation, which was scribed in my presence. The recorded information has been reviewed and considered. No att. providers found   Donnetta Hutching, MD 01/07/11 [phone]

## 2011-01-09 NOTE — H&P (Signed)
NAMEJADRIEN, NARINE NO.:  [id number]  MEDICAL RECORD NO.:  [id number]  LOCATION:  A306                          FACILITY:  [facility]  PHYSICIAN:  Tarry Kos, MD       DATE OF BIRTH:  March 03, 1946  DATE OF ADMISSION:  01/03/2011 DATE OF DISCHARGE:                             HISTORY & PHYSICAL   CHIEF COMPLAINTS:  Snakebite.  HISTORY OF PRESENT ILLNESS:  [name] is a 65 year old male who earlier this afternoon in the [place] area suffered from a copperhead snakebite to the left hand.  He is currently in [facility].  I am transferring him to [facility] due to the significant amount of swelling that he has had up to above the elbow.  He says that the extremity is painful which is appropriate for a copperhead snakebite.  He has already received CroFab in the ED, however, the hand is already starting to turn mildly purplish in color.  I have called Poison Control.  In the ED, his hand has been kept below the heart, however, I have called Poison Control and they recommend elevating the extremity above the heart, which may be why his swelling is getting a little worse.  His vitals have been stable.  I am transferring him to [facility] in case he develops compartment syndrome and need the hand surgeon.  REVIEW OF SYSTEMS:  Otherwise negative.  PAST MEDICAL HISTORY: 1. PTSD. 2. Depression and anxiety. 3,  Panic attacks.  ALLERGIES:  None.  MEDICATIONS:  Aspirin, Celexa, clonazepam, hydrocodone/acetaminophen, ibuprofen, niacin, omeprazole, oxycodone, and trazodone.  SOCIAL HISTORY:  Nonsmoker.  Occasional alcohol.  No IV drug abuse.  REVIEW OF SYSTEMS:  Otherwise negative.  PHYSICAL EXAMINATION:  VITAL SIGNS:  Temperature 97.7, pulse 76, respirations 20, blood pressure 145/89, and 95% O2 sats on room air. GENERAL:  He is alert and oriented x4, in no apparent stress, cooperative and friendly. HEENT:  Extraocular muscles are intact.  Pupils are equal  and reactive to light.  Oropharynx is clear.  Mucous membranes are moist. NECK:  No JVD.  No carotid bruits. CARDIAC:  Regular rate and rhythm without murmurs, rubs, or gallops. CHEST:  Clear to auscultation bilaterally.  No wheezes, rhonchi, or rales. ABDOMEN:  Soft, nontender, and nondistended.  Positive bowel sounds.  No hepatosplenomegaly. EXTREMITIES:  No clubbing, cyanosis, or edema to the bilateral lower extremities.  His left upper extremity has a moderate amount of swelling.  The hand is swollen and puffy.  He has got swelling up to the elbow.  Pulses are intact.  Sensation is intact.  However, his hand has a purplish mild discoloration developing.  There is no obvious necrosis developed yet.  LABORATORY DATA:  LFTs are normal.  Coags are normal.  Fibrinogen level is normal at 244.  CBC is normal.  ASSESSMENT/PLAN:  This is a 65 year old male with copperhead snakebite to left hand that has already received CroFab. 1. Copperhead snake bite.  The patient is being transferred to [facility].  I have written for neurological checks to his left     upper extremity every 2 hours.  I have also gotten copy of  guidelines from Poison Control concerning elevation of the     extremity and appropriate measurements of the upper extremity to     monitor closely.  We will place him on IV fluids.  We will also     recheck a fibrinogen, coags, and CBC in the morning to monitor his     platelet count also.  In the morning, I would call hand surgeon or     later on tonight if this worsens.  I have called the floor call     physician that is on call at [facility] to reassess the patient     when he arrives at [facility] in a couple of hours.  The patient is     currently hemodynamically stable.  We will also provide him some     Dilaudid as needed for pain. 2. Posttraumatic stress disorder with anxiety.  We will provide him     some Ativan also as needed for anxiety.  Plan has  been discussed     with the patient and he agrees with the transfer.          ______________________________ Tarry Kos, MD     RD/MEDQ  D:  01/04/2011  T:  01/04/2011  Job:  [id number]  Electronically Signed by Tarry Kos MD on 01/09/2011 10:56:16 AM

## 2011-01-10 LAB — BODY FLUID CULTURE: Culture: NO GROWTH

## 2011-01-17 NOTE — Consult Note (Signed)
[name], Yu NO.:  [id number]  MEDICAL RECORD NO.:  [id number]  LOCATION:  3715                         FACILITY:  [facility]  PHYSICIAN:  Jones Broom, MD    DATE OF BIRTH:  25-Jul-1945  DATE OF CONSULTATION:  01/07/2011 DATE OF DISCHARGE:                                CONSULTATION   REASON FOR CONSULTATION:  Evaluation of left knee pain.  HISTORY OF PRESENT ILLNESS:  [name] is a very pleasant 65 year old gentleman who was bit by a copperhead snake in the left hand about 4 days ago.  He was admitted to the hospital on the hospitalist service where he had antivenom treatment with CroFab.  He also had some antibiotics for concern of early cellulitis.  Starting Sunday night January 05, 2011, she began having increasing left knee pain and swelling.  There was no injury to the knee initially.  Pain was severe, keeping him awake at night last night.  It is anterior and lateral on the knee, worse with any movement, better with rest, better with ice. He has had a history of gout in the left great toe, but never in the knee.  PAST MEDICAL HISTORY:  Significant for: 1. PTSD. 2. Depression. 3. Anxiety. 4. Panic attacks.  ALLERGIES:  NKDA.  MEDICATIONS:  Prehospital include aspirin, Celexa, clonazepam, hydrocodone, ibuprofen, niacin, omeprazole, oxycodone, and trazodone.  SOCIAL HISTORY:  Denies tobacco use.  Drinks occasional alcohol.  No IV drug abuse.  REVIEW OF SYSTEMS:  As above otherwise negative.  PHYSICAL EXAMINATION:  GENERAL:  Today, he is afebrile with temperature 98.1, pulse 83, blood pressure 145/76, respiratory rate 18.  He is awake, alert and oriented x3.  Extraocular motions intact. EXTREMITIES:  Examination of the left lower extremity demonstrates a large knee effusion with a ballotable patella.  There is some moderate warmth compared to other knee, but no erythema.  He is able to range the motion within about 20 degrees arc of  commotion, but it is very painful to him.  Distally, he is neurovascularly intact.  DIAGNOSTIC STUDIES:  WBC 10.4, hemoglobin 13.7, hematocrit 40.1, platelets 185.  X-rays including 2 views of the left knee from January 06, 2011, demonstrates some mild patellofemoral degenerative changes and a large joint effusion.  IMPRESSION:  Acute left knee pain and swelling.  PLAN:  This likely represents gout, which may have been precipitated by the antivenom.  We talked about aspiration, which I think will be both diagnostic and therapeutic for him.  I told him that if the aspirate looks benign and there is not concern for infection, which I believe clinically there is not, we could put some steroid and the knee is well which will help alleviate the symptoms of gout.  He elected to go forward with that.  PROCEDURE:  Under sterile conditions with Betadine prep, I first infiltrated the superolateral patellar area with 5 mL of 1% lidocaine without epinephrine and subsequently aspirated the knee through this position using a 18-gauge 1.5-inch needle and a 60 mL syringe.  I aspirated 80 mL of slightly cloudy yellow synovial fluid.  Did not appear to be pus.  Therefore, I also injected 1 mL of 40  mg Kenalog with 7 mL of 1% lidocaine without epinephrine back into the joint.  He did endorse immediate relief of his discomfort.  He was then wrapped in Ace bandage.  The synovial fluid was sent for stat culture, Gram stain, cell count and crystal analysis was found to have intracellular monosodium urate crystals consistent with gout.  WBC count was 9400.  PLAN:  He was told that the diagnosis is gout and treatment will be the aspiration, steroid injection in addition to ice and anti- inflammatories.  He has an orthopedist at home named Dr. Romeo Apple who he will follow up with.  I also gave him my card and told him I will happy to see him back in my office as needed in the future.  If he has problems  with recurrent gout, he may wish to seek the advise with his medical doctor regarding long term medical control of gout.  Please call with any other questions or concerns.     Jones Broom, MD     JC/MEDQ  D:  01/07/2011  T:  01/07/2011  Job:  [id number]  Electronically Signed by Jones Broom  on 01/17/2011 09:40:05 AM

## 2011-01-23 NOTE — Discharge Summary (Signed)
NAMEMarland Yu  MOSS, BERRY NO.:  [id number]  MEDICAL RECORD NO.:  [id number]  LOCATION:  3715                         FACILITY:  [facility]  PHYSICIAN:  Ramiro Harvest, MD    DATE OF BIRTH:  07/06/1945  DATE OF ADMISSION:  01/04/2011 DATE OF DISCHARGE:                        DISCHARGE SUMMARY   PRIMARY CARE PHYSICIAN:  Kirk Ruths, MD, in [facility].  ORTHOPEDIC DOCTOR:  Vickki Hearing, MD, [facility].  DISCHARGE DIAGNOSES: 1. Snakebite, improved. 2. Left knee gouty arthritis, improved. 3. Headache. 4. Post-traumatic stress disorder/anxiety. 5. History of depression. 6. History of panic attacks.  DISCHARGE MEDICATIONS: 1. Augmentin 875 mg p.o. b.i.d. x5 days. 2. Indocin 50 mg p.o. t.i.d. x4 days. 3. Aspirin 81 mg p.o. daily. 4. Cholecalciferol 1000 units p.o. daily. 5. Citalopram 10 mg p.o. daily. 6. Clonazepam 0.5 mg p.o. b.i.d. 7. Ibuprofen 800 mg p.o. daily. 8. Multivitamin 1 tablet p.o. daily. 9. Niacin SR 500 mg p.o. daily. 10.Norco 10/325 one tablet p.o. q.6 h. p.r.n. 11.Percocet 7.5/325 one tablet p.o. q.4 h. P.r.n. 12.Omeprazole 20 mg p.o. daily. 13.Trazodone 50 mg 1-2 tablets p.o. at bedtime. 14.Vitamin B complex 1 tablet p.o. daily  DISPOSITION AND FOLLOWUP:  The patient will be discharged home.  The patient is to follow up with PCP, Dr. Regino Schultze in 1 week.  The patient was also to follow up with his orthopedics doctor, Dr. Romeo Apple, in 1 week to followup on his left knee gouty arthritis.  The patient is also to follow up with Dr. Mina Marble of Hand Surgeon in 1-2 weeks.  CONSULTATIONS DONE: 1. Hand surgery consultation was done.  The patient was seen in     consultation by Dr. Mina Marble on January 04, 2011. 2. Orthopedic consultation was done.  The patient was seen in     consultation by Dr. Ave Filter of Orthopedics on January 06, 2011.  PROCEDURES PERFORMED: 1. A CT of the head without contrast was done on January 05, 2011, that     was negative. 2. 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 3. The patient did have a knee aspiration done on January 06, 2011, per     Dr. Ave Filter with Kenalog injections also done at the same time.  BRIEF ADMISSION HISTORY AND PHYSICAL:  Mr. Waino Mounsey is a 65 year old Caucasian gentleman who earlier on in the afternoon on the day of admission in [place] area suffered a copperhead snakebite to his left hand.  The patient with at [facility] and he was then transferred to [facility] per the admitting physician due to a significant amount of swelling that he had up to his elbow.  The patient had stated to the admitting physician that the extremity was extremely painful, which was appropriate for a copperhead snakebite.  The patient already had received CroFab in the ED at [facility]; however, his had was already starting to turn  mildly purplish in color, and as such the admitting physician  was concerned and transferred the patient to [facility] to be assessed by the hand surgeon.  Poison Control was called and it was recommended that the patient keep his upper extremity above the heart.  His vitals had been stable.  For the rest of the admission history and physical, please see H and P dictated by Dr. Onalee Hua of job [id number]. 1. Copperhead snakebite.  The patient was admitted with a copperhead     snakebite.  There was concern for possible early cellulitis and     compartment syndrome and as such the patient was transferred to     [facility].  The patient had already received CroFab as     well as guidelines from Poison Control concerning management of his     snakebite.  Fibrinogen coags and CBC were monitored as well as his     platelet  count which seemed to be stable.  The patient's extremity     was elevated.  He was placed empirically on IV Zosyn.  He was seen     by hand surgery.  The patient did have good capillary refill and     did not have any compartment syndrome.  The patient was     subsequently transitioned to oral Augmentin.  He will complete 5     more days of Augmentin to complete a 1-week course of antibiotic     therapy.  The patient is to follow up with a hand surgeon, Dr.     Mina Marble as stated above.  The patient improved clinically during     the hospitalization.  He will be discharged home in stable and     improved condition.  Poison Control will be alerted of the     patient's discharge and to follow up with the patient has an     outpatient. 2. Left knee gouty arthritis.  During the hospitalization, the patient     did complain of left knee pain and inability to flex and extend his     left lower extremity.  On examination, the patient was noted to     have a suprapatellar effusion.  An x-ray of the knee was done which     was consistent with a moderately large effusion.  An orthopedic     consultation was obtained.  The patient was seen in consultation by     Dr. Ave Filter on January 06, 2011.  A knee aspiration was done and     some Kenalog was injected into the knee, aspirate, and synovial     fluid was sent for culture, Gram stain, cell count, and crystal     analysis, and crystal analysis was found to have some intracellular     monosodium urate crystals which were consistent with gout.  Gram     stain was negative with no organisms noted.  Cell count did have     9400 white cells and 70% neutrophils.  The patient was placed on     Indocin 50 mg 3 times daily.  He was discharged home on Indocin 50     mg daily.  The patient improved clinically such that by the day of     discharge, the patient was in stable and improved condition.  The     patient will follow up with his orthopedic surgeon,  Dr. Fuller Canada, as a outpatient.  The patient will be discharged in  stable and improved condition. 3. Headache during the hospitalization.  The patient did have some     complaints of headaches.  The patient did state that he felt that     something had popped in his head, and as such a CT of the head was     obtained which was negative.  The patient's headache was likely     secondary to the copperhead snakebite.  The patient's headache was     managed and controlled, and had resolved by the day of discharge.     The rest of the patient's chronic issues had been stable throughout     the hospitalization and the patient will be discharged in a stable     and improved condition.  On the day of discharge, vital signs     temperature 98.3, pulse of 78, respirations 18, blood pressure     126/73, saturating 99% on room air.  DISCHARGE LABS:  Sodium 138, potassium 4.3, chloride 102, bicarb 28, glucose 141, BUN 18, creatinine 0.79, and a calcium of 9.3.  CBC with a white count of 8.2, hemoglobin 13.6, hematocrit 39.6, and a platelet count of 183,000.  It has been a pleasure taking care of Dr. Loyal Buba.     Ramiro Harvest, MD     DT/MEDQ  D:  01/07/2011  T:  01/07/2011  Job:  [id number]  cc:   Kirk Ruths, M.D. Vickki Hearing, M.D. Jones Broom, MD [employer]. Mina Marble, M.D.  Electronically Signed by Ramiro Harvest MD on 01/23/2011 12:17:28 PM

## 2011-01-24 NOTE — Progress Notes (Signed)
Encounter addended by: Clarene Critchley on: 01/24/2011 10:08 AM<BR>     Documentation filed: Flowsheet VN

## 2011-01-28 NOTE — ED Notes (Signed)
I personally performed the services described in this documentation, which was scribed in my presence. The recorded information has been reviewed and considered. No att. providers found   Donnetta Hutching, MD 01/28/11 1943

## 2011-01-28 NOTE — Progress Notes (Signed)
Encounter addended by: Donnetta Hutching, MD on: 01/28/2011  7:46 PM<BR>     Documentation filed: Notes Section

## 2011-01-28 NOTE — ED Notes (Signed)
I personally performed the services described in this documentation, which was scribed in my presence. The recorded information has been reviewed and considered. No att. providers found   Donnetta Hutching, MD 01/28/11 [phone]

## 2011-01-28 NOTE — Progress Notes (Signed)
Encounter addended by: Donnetta Hutching, MD on: 01/28/2011 12:35 AM<BR>     Documentation filed: Notes Section

## 2011-02-05 NOTE — Progress Notes (Signed)
Encounter addended by: Karen Kays on: 02/05/2011 12:26 PM<BR>     Documentation filed: Flowsheet VN

## 2011-02-18 ENCOUNTER — Telehealth: Payer: Self-pay | Admitting: Orthopedic Surgery

## 2011-02-18 NOTE — Telephone Encounter (Signed)
Patient called to relay that he had intended to call in after his discharge from [facility], just to be sure that all the records of his hospitalization and treatment for snake bite were in his chart for Dr. Romeo Apple.  His orthopedist at [facility] was Dr. Polo Riley, [place].  He will check on any office notes also and see that we have a copy in his chart.  He said he is doing great.  His ph# is [phone].

## 2011-02-19 ENCOUNTER — Telehealth: Payer: Self-pay | Admitting: Orthopedic Surgery

## 2011-02-19 NOTE — Telephone Encounter (Signed)
(  Continued from 02/18/11 note) Patient + our office confirmed with orthopedic surgeon's office, Dr. Jones Broom, ph [phone], fax [phone], that there are no additional medical records from their office, as patient was seen by Dr. Ave Filter in the hospital only at [facility].  He wanted to let Dr. Romeo Apple know.

## 2011-02-20 NOTE — Telephone Encounter (Signed)
Error, no additional notes.

## 2011-05-19 ENCOUNTER — Ambulatory Visit (HOSPITAL_COMMUNITY)
Admission: RE | Admit: 2011-05-19 | Discharge: 2011-05-19 | Disposition: A | Discharge status: Home / Self Care | Payer: No Typology Code available for payment source | Source: Ambulatory Visit | Attending: Family Medicine | Admitting: Family Medicine

## 2011-05-19 ENCOUNTER — Other Ambulatory Visit (HOSPITAL_COMMUNITY): Payer: Self-pay | Admitting: Family Medicine

## 2011-05-19 DIAGNOSIS — M779 Enthesopathy, unspecified: Secondary | ICD-10-CM

## 2011-05-19 DIAGNOSIS — M25519 Pain in unspecified shoulder: Secondary | ICD-10-CM | POA: Insufficient documentation

## 2012-01-23 ENCOUNTER — Ambulatory Visit (HOSPITAL_COMMUNITY)
Admission: RE | Admit: 2012-01-23 | Discharge: 2012-01-23 | Disposition: A | Discharge status: Home / Self Care | Payer: No Typology Code available for payment source | Source: Ambulatory Visit | Attending: Internal Medicine | Admitting: Internal Medicine

## 2012-01-23 ENCOUNTER — Other Ambulatory Visit (HOSPITAL_COMMUNITY): Payer: Self-pay | Admitting: Internal Medicine

## 2012-01-23 DIAGNOSIS — T07XXXA Unspecified multiple injuries, initial encounter: Secondary | ICD-10-CM | POA: Insufficient documentation

## 2012-01-23 DIAGNOSIS — R079 Chest pain, unspecified: Secondary | ICD-10-CM | POA: Insufficient documentation

## 2012-01-23 DIAGNOSIS — R52 Pain, unspecified: Secondary | ICD-10-CM

## 2012-01-23 DIAGNOSIS — M25559 Pain in unspecified hip: Secondary | ICD-10-CM | POA: Insufficient documentation

## 2012-01-23 DIAGNOSIS — X58XXXA Exposure to other specified factors, initial encounter: Secondary | ICD-10-CM

## 2012-01-23 DIAGNOSIS — M25519 Pain in unspecified shoulder: Secondary | ICD-10-CM | POA: Insufficient documentation

## 2012-02-10 ENCOUNTER — Ambulatory Visit (INDEPENDENT_AMBULATORY_CARE_PROVIDER_SITE_OTHER): Payer: No Typology Code available for payment source | Admitting: Orthopedic Surgery

## 2012-02-10 ENCOUNTER — Encounter: Payer: Self-pay | Admitting: Orthopedic Surgery

## 2012-02-10 VITALS — BP 138/70 | Ht 66.0 in | Wt 222.0 lb

## 2012-02-10 DIAGNOSIS — S40019A Contusion of unspecified shoulder, initial encounter: Secondary | ICD-10-CM | POA: Insufficient documentation

## 2012-02-10 DIAGNOSIS — M25519 Pain in unspecified shoulder: Secondary | ICD-10-CM | POA: Insufficient documentation

## 2012-02-10 NOTE — Progress Notes (Signed)
  Subjective:    Patient ID: Jesse Yu, male    DOB: Sep 24, 1945, 66 y.o.   MRN: [id number]  HPI Comments: This patient rolled over a tractor landed on his right shoulder and went to the ER for x-rays there were negative they also checked his hip x-rays were negative  He had another injury to his shoulder last year did get it checked out so on this occasion at the ER they recommended an orthopedic followup  Shoulder Pain  The pain is present in the right shoulder and right arm. This is a new problem. The current episode started 1 to 4 weeks ago (01/22/2012). There has been a history of trauma. The problem occurs daily. The problem has been unchanged. The quality of the pain is described as sharp and aching. The pain is at a severity of 8/10. Associated symptoms include a limited range of motion and stiffness. Pertinent negatives include no fever, inability to bear weight, itching, joint locking, joint swelling, numbness or tingling.      Review of Systems  Constitutional: Negative for fever.  Musculoskeletal: Positive for stiffness.  Skin: Negative for itching.  Neurological: Negative for tingling and numbness.   hollow he has listed all his review of systems is normal he has chronic pain he takes oxycodone for that. He also takes ibuprofen 800 mg once a day.     Objective:   Physical Exam  Constitutional: He is oriented to person, place, and time. He appears well-developed and well-nourished. No distress.  HENT:  Head: Normocephalic.  Neck: Normal range of motion. Neck supple. No JVD present.  Cardiovascular: Normal rate and intact distal pulses.   Pulmonary/Chest: Effort normal. No respiratory distress.  Lymphadenopathy:    He has no cervical adenopathy.  Neurological: He is alert and oriented to person, place, and time. He has normal reflexes.  Skin: Skin is warm and dry. He is not diaphoretic.  Psychiatric: He has a normal mood and affect. His behavior is normal. Judgment  and thought content normal.  Right Shoulder Exam   Tenderness  Right shoulder tenderness location: There is some mild tenderness globally in the anterior and middle deltoid.  Range of Motion  The patient has normal right shoulder ROM.  Muscle Strength  The patient has normal right shoulder strength.  Tests  Apprehension: negative Cross Arm: negative Drop Arm: negative Impingement: negative  Other  Erythema: absent Scars: absent Sensation: normal Pulse: present   Left Shoulder Exam  Left shoulder exam is normal.  Tenderness  The patient is experiencing no tenderness.     Range of Motion  The patient has normal left shoulder ROM.  Muscle Strength  The patient has normal left shoulder strength.  Tests  Apprehension: negative Drop Arm: negative  Other  Sensation: normal Pulse: present   Comments:  No instability has been detected           Assessment & Plan:  The x-rays at the hospital were reviewed including the pelvic x-ray. My interpretation is that there is no fracture dislocation or acute process  Impression shoulder pain secondary to contusion  No rotator cuff tear detected clinically  Recommend home exercise program for 6 weeks if no improvement then he will call us and we can order an MRI of his shoulder if needed

## 2012-02-10 NOTE — Patient Instructions (Addendum)
activities as tolerated  Use heat as needed   Also you can use Biofreeze or Max freeze 3 times a day until pain resolves   Shoulder Exercises EXERCISES   RANGE OF MOTION (ROM) AND STRETCHING EXERCISES These exercises may help you when beginning to rehabilitate your injury. Your symptoms may resolve with or without further involvement from your physician, physical therapist or athletic trainer. While completing these exercises, remember:    Restoring tissue flexibility helps normal motion to return to the joints. This allows healthier, less painful movement and activity.   An effective stretch should be held for at least 30 seconds.   A stretch should never be painful. You should only feel a gentle lengthening or release in the stretched tissue.  ROM - Pendulum  Bend at the waist so that your right / left arm falls away from your body. Support yourself with your opposite hand on a solid surface, such as a table or a countertop.   Your right / left arm should be perpendicular to the ground. If it is not perpendicular, you need to lean over farther. Relax the muscles in your right / left arm and shoulder as much as possible.   Gently sway your hips and trunk so they move your right / left arm without any use of your right / left shoulder muscles.   Progress your movements so that your right / left arm moves side to side, then forward and backward, and finally, both clockwise and counterclockwise.   Complete ____15______ repetitions in each direction. Many people use this exercise to relieve discomfort in their shoulder as well as to gain range of motion.  Repeat _____3_____ times. Complete this exercise ______3____ times per day. STRETCH - Flexion, Standing  Stand with good posture. With an underhand grip on your right / left hand and an overhand grip on the opposite hand, grasp a broomstick or cane so that your hands are a little more than shoulder-width apart.   Keeping your right /  left elbow straight and shoulder muscles relaxed, push the stick with your opposite hand to raise your right / left arm in front of your body and then overhead. Raise your arm until you feel a stretch in your right / left shoulder, but before you have increased shoulder pain.   Try to avoid shrugging your right / left shoulder as your arm rises by keeping your shoulder blade tucked down and toward your mid-back spine. Hold __________ seconds.   Slowly return to the starting position.  Repeat ____3______ times. Complete this exercise _____3_____ times per day. STRETCH - Internal Rotation  Place your right / left hand behind your back, palm-up.   Throw a towel or belt over your opposite shoulder. Grasp the towel/belt with your right / left hand.   While keeping an upright posture, gently pull up on the towel/belt until you feel a stretch in the front of your right / left shoulder.   Avoid shrugging your right / left shoulder as your arm rises by keeping your shoulder blade tucked down and toward your mid-back spine.   Hold ___3_______. Release the stretch by lowering your opposite hand.  Repeat ____3______ times. Complete this exercise _____3_____ times per day. STRETCH - External Rotation and Abduction  Stagger your stance through a doorframe. It does not matter which foot is forward.   As instructed by your physician, physical therapist or athletic trainer, place your hands:   And forearms above your head and on the  door frame.   And forearms at head-height and on the door frame.   At elbow-height and on the door frame.   Keeping your head and chest upright and your stomach muscles tight to prevent over-extending your low-back, slowly shift your weight onto your front foot until you feel a stretch across your chest and/or in the front of your shoulders.   Hold ____3______ seconds. Shift your weight to your back foot to release the stretch.  Repeat ______3____ times. Complete this  stretch ___3_______ times per day.   STRENGTHENING EXERCISES  These exercises may help you when beginning to rehabilitate your injury. They may resolve your symptoms with or without further involvement from your physician, physical therapist or athletic trainer. While completing these exercises, remember:    Muscles can gain both the endurance and the strength needed for everyday activities through controlled exercises.   Complete these exercises as instructed by your physician, physical therapist or athletic trainer. Progress the resistance and repetitions only as guided.   You may experience muscle soreness or fatigue, but the pain or discomfort you are trying to eliminate should never worsen during these exercises. If this pain does worsen, stop and make certain you are following the directions exactly. If the pain is still present after adjustments, discontinue the exercise until you can discuss the trouble with your clinician.   If advised by your physician, during your recovery, avoid activity or exercises which involve actions that place your right / left hand or elbow above your head or behind your back or head. These positions stress the tissues which are trying to heal.  STRENGTH - Scapular Depression and Adduction  With good posture, sit on a firm chair. Supported your arms in front of you with pillows, arm rests or a table top. Have your elbows in line with the sides of your body.   Gently draw your shoulder blades down and toward your mid-back spine. Gradually increase the tension without tensing the muscles along the top of your shoulders and the back of your neck.   Hold for ____3______ seconds. Slowly release the tension and relax your muscles completely before completing the next repetition.   After you have practiced this exercise, remove the arm support and complete it in standing as well as sitting.  Repeat ____3______ times. Complete this exercise _____3_____ times per day.     STRENGTH - External Rotators  Secure a rubber exercise band/tubing to a fixed object so that it is at the same height as your right / left elbow when you are standing or sitting on a firm surface.   Stand or sit so that the secured exercise band/tubing is at your side that is not injured.   Bend your elbow 90 degrees. Place a folded towel or small pillow under your right / left arm so that your elbow is a few inches away from your side.   Keeping the tension on the exercise band/tubing, pull it away from your body, as if pivoting on your elbow. Be sure to keep your body steady so that the movement is only coming from your shoulder rotating.   Hold _____3_____ seconds. Release the tension in a controlled manner as you return to the starting position.  Repeat __3________ times. Complete this exercise ___3_______ times per day.   STRENGTH - Supraspinatus  Stand or sit with good posture. Grasp a ___small_______ weight or an exercise band/tubing so that your hand is "thumbs-up," like when you shake hands.   Slowly  lift your right / left hand from your thigh into the air, traveling about 30 degrees from straight out at your side. Lift your hand to shoulder height or as far as you can without increasing any shoulder pain. Initially, many people do not lift their hands above shoulder height.   Avoid shrugging your right / left shoulder as your arm rises by keeping your shoulder blade tucked down and toward your mid-back spine.   Hold for ____3______ seconds. Control the descent of your hand as you slowly return to your starting position.  Repeat ______3____ times. Complete this exercise ____3______ times per day.   STRENGTH - Shoulder Extensors  Secure a rubber exercise band/tubing so that it is at the height of your shoulders when you are either standing or sitting on a firm arm-less chair.   With a thumbs-up grip, grasp an end of the band/tubing in each hand. Straighten your elbows and lift  your hands straight in front of you at shoulder height. Step back away from the secured end of band/tubing until it becomes tense.   Squeezing your shoulder blades together, pull your hands down to the sides of your thighs. Do not allow your hands to go behind you.   Hold for ___3_______ seconds. Slowly ease the tension on the band/tubing as you reverse the directions and return to the starting position.  Repeat ____3______ times. Complete this exercise ____3______ times per day.   STRENGTH - Scapular Retractors  Secure a rubber exercise band/tubing so that it is at the height of your shoulders when you are either standing or sitting on a firm arm-less chair.   With a palm-down grip, grasp an end of the band/tubing in each hand. Straighten your elbows and lift your hands straight in front of you at shoulder height. Step back away from the secured end of band/tubing until it becomes tense.   Squeezing your shoulder blades together, draw your elbows back as you bend them. Keep your upper arm lifted away from your body throughout the exercise.   Hold _____3_____ seconds. Slowly ease the tension on the band/tubing as you reverse the directions and return to the starting position.  Repeat _____3_____ times. Complete this exercise ___3_______ times per day. STRENGTH - Scapular Depressors  Find a sturdy chair without wheels, such as a from a dining room table.   Keeping your feet on the floor, lift your bottom from the seat and lock your elbows.   Keeping your elbows straight, allow gravity to pull your body weight down. Your shoulders will rise toward your ears.   Raise your body against gravity by drawing your shoulder blades down your back, shortening the distance between your shoulders and ears. Although your feet should always maintain contact with the floor, your feet should progressively support less body weight as you get stronger.   Hold ____3______ seconds. In a controlled and slow  manner, lower your body weight to begin the next repetition.  Repeat _____3_____ times. Complete this exercise ___3_______ times per day.   Document Released: 03/19/2005 Document Revised: 04/24/2011 Document Reviewed: 08/17/2008 [facility] Patient Information 2012 [place], [place].

## 2012-04-21 ENCOUNTER — Encounter: Payer: Self-pay | Admitting: Orthopedic Surgery

## 2012-04-21 ENCOUNTER — Ambulatory Visit (INDEPENDENT_AMBULATORY_CARE_PROVIDER_SITE_OTHER): Payer: No Typology Code available for payment source

## 2012-04-21 ENCOUNTER — Ambulatory Visit (INDEPENDENT_AMBULATORY_CARE_PROVIDER_SITE_OTHER): Payer: No Typology Code available for payment source | Admitting: Orthopedic Surgery

## 2012-04-21 VITALS — Ht 66.0 in | Wt 220.0 lb

## 2012-04-21 DIAGNOSIS — M2390 Unspecified internal derangement of unspecified knee: Secondary | ICD-10-CM

## 2012-04-21 DIAGNOSIS — M25569 Pain in unspecified knee: Secondary | ICD-10-CM

## 2012-04-21 NOTE — Patient Instructions (Addendum)
Apply ice  Try to bend and straighten the knee as much as you can  Use crutches to ambulate  You have been scheduled for an MRI scan.  Your insurance company requires a precertification prior to scheduling the MRI.  If the MRI scan is not approved we will let you know and make further treatment recommendations according to your insurance's guidelines.    * We will call you with the results

## 2012-04-26 ENCOUNTER — Telehealth: Payer: Self-pay | Admitting: Orthopedic Surgery

## 2012-04-26 NOTE — Telephone Encounter (Signed)
Jesse Yu called this morning asking for his records to be sent to [facility].  He is getting a consult on his left knee and [facility] [phone]   Phone # [phone]  Press [phone]

## 2012-04-27 ENCOUNTER — Encounter: Payer: Self-pay | Admitting: Orthopedic Surgery

## 2012-04-27 DIAGNOSIS — M2390 Unspecified internal derangement of unspecified knee: Secondary | ICD-10-CM | POA: Insufficient documentation
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
## 2012-09-10 ENCOUNTER — Ambulatory Visit (HOSPITAL_COMMUNITY)
Admission: RE | Admit: 2012-09-10 | Discharge: 2012-09-10 | Disposition: A | Discharge status: Home / Self Care | Payer: No Typology Code available for payment source | Source: Ambulatory Visit | Attending: Physician Assistant | Admitting: Physician Assistant

## 2012-09-10 ENCOUNTER — Other Ambulatory Visit (HOSPITAL_COMMUNITY): Payer: Self-pay | Admitting: Physician Assistant

## 2012-09-10 DIAGNOSIS — W19XXXA Unspecified fall, initial encounter: Secondary | ICD-10-CM | POA: Insufficient documentation

## 2012-09-10 DIAGNOSIS — M25569 Pain in unspecified knee: Secondary | ICD-10-CM | POA: Insufficient documentation

## 2012-09-10 DIAGNOSIS — M25511 Pain in right shoulder: Secondary | ICD-10-CM

## 2012-09-10 DIAGNOSIS — M76899 Other specified enthesopathies of unspecified lower limb, excluding foot: Secondary | ICD-10-CM

## 2012-09-10 DIAGNOSIS — T07XXXA Unspecified multiple injuries, initial encounter: Secondary | ICD-10-CM | POA: Insufficient documentation

## 2012-09-10 DIAGNOSIS — M25519 Pain in unspecified shoulder: Secondary | ICD-10-CM | POA: Insufficient documentation

## 2012-09-10 DIAGNOSIS — M25469 Effusion, unspecified knee: Secondary | ICD-10-CM | POA: Insufficient documentation

## 2012-09-24 ENCOUNTER — Other Ambulatory Visit (HOSPITAL_COMMUNITY): Payer: Self-pay | Admitting: Family Medicine

## 2012-09-24 ENCOUNTER — Ambulatory Visit (HOSPITAL_COMMUNITY)
Admission: RE | Admit: 2012-09-24 | Discharge: 2012-09-24 | Disposition: A | Discharge status: Home / Self Care | Payer: No Typology Code available for payment source | Source: Ambulatory Visit | Attending: Family Medicine | Admitting: Family Medicine

## 2012-09-24 DIAGNOSIS — S2239XA Fracture of one rib, unspecified side, initial encounter for closed fracture: Secondary | ICD-10-CM

## 2012-09-24 DIAGNOSIS — W19XXXA Unspecified fall, initial encounter: Secondary | ICD-10-CM | POA: Insufficient documentation

## 2012-09-24 DIAGNOSIS — R079 Chest pain, unspecified: Secondary | ICD-10-CM | POA: Insufficient documentation

## 2012-09-24 DIAGNOSIS — S298XXA Other specified injuries of thorax, initial encounter: Secondary | ICD-10-CM | POA: Insufficient documentation

## 2013-06-13 DIAGNOSIS — G8929 Other chronic pain: Secondary | ICD-10-CM | POA: Diagnosis not present

## 2013-06-13 DIAGNOSIS — J209 Acute bronchitis, unspecified: Secondary | ICD-10-CM | POA: Diagnosis not present

## 2013-06-13 DIAGNOSIS — Z6831 Body mass index (BMI) 31.0-31.9, adult: Secondary | ICD-10-CM | POA: Diagnosis not present

## 2013-06-13 DIAGNOSIS — G4700 Insomnia, unspecified: Secondary | ICD-10-CM | POA: Diagnosis not present

## 2013-06-21 ENCOUNTER — Ambulatory Visit (HOSPITAL_COMMUNITY)
Admission: RE | Admit: 2013-06-21 | Discharge: 2013-06-21 | Disposition: A | Discharge status: Home / Self Care | Payer: Medicare Other | Source: Ambulatory Visit | Attending: Internal Medicine | Admitting: Internal Medicine

## 2013-06-21 ENCOUNTER — Other Ambulatory Visit (HOSPITAL_COMMUNITY): Payer: Self-pay | Admitting: Internal Medicine

## 2013-06-21 DIAGNOSIS — R059 Cough, unspecified: Secondary | ICD-10-CM | POA: Diagnosis not present

## 2013-06-21 DIAGNOSIS — Z6830 Body mass index (BMI) 30.0-30.9, adult: Secondary | ICD-10-CM | POA: Diagnosis not present

## 2013-06-21 DIAGNOSIS — R509 Fever, unspecified: Secondary | ICD-10-CM | POA: Insufficient documentation

## 2013-06-21 DIAGNOSIS — R0609 Other forms of dyspnea: Secondary | ICD-10-CM | POA: Diagnosis not present

## 2013-06-21 DIAGNOSIS — M159 Polyosteoarthritis, unspecified: Secondary | ICD-10-CM | POA: Diagnosis not present

## 2013-06-21 DIAGNOSIS — J157 Pneumonia due to Mycoplasma pneumoniae: Secondary | ICD-10-CM | POA: Diagnosis not present

## 2013-06-21 DIAGNOSIS — R05 Cough: Secondary | ICD-10-CM | POA: Insufficient documentation

## 2013-06-21 DIAGNOSIS — R0989 Other specified symptoms and signs involving the circulatory and respiratory systems: Secondary | ICD-10-CM | POA: Diagnosis not present

## 2013-06-21 DIAGNOSIS — G8929 Other chronic pain: Secondary | ICD-10-CM | POA: Diagnosis not present

## 2013-09-29 DIAGNOSIS — Z6830 Body mass index (BMI) 30.0-30.9, adult: Secondary | ICD-10-CM | POA: Diagnosis not present

## 2013-09-29 DIAGNOSIS — F3289 Other specified depressive episodes: Secondary | ICD-10-CM | POA: Diagnosis not present

## 2013-09-29 DIAGNOSIS — F329 Major depressive disorder, single episode, unspecified: Secondary | ICD-10-CM | POA: Diagnosis not present

## 2013-09-29 DIAGNOSIS — Z0000 Encounter for general adult medical examination without abnormal findings: Secondary | ICD-10-CM | POA: Diagnosis not present

## 2013-09-29 DIAGNOSIS — G8929 Other chronic pain: Secondary | ICD-10-CM | POA: Diagnosis not present

## 2014-04-26 DIAGNOSIS — J180 Bronchopneumonia, unspecified organism: Secondary | ICD-10-CM | POA: Diagnosis not present

## 2014-04-26 DIAGNOSIS — Z6826 Body mass index (BMI) 26.0-26.9, adult: Secondary | ICD-10-CM | POA: Diagnosis not present

## 2014-11-11 IMAGING — CR DG CHEST 2V
2 series · 2 of 2 positions shown · non-contrast
Comparison: September 24, 2012

CLINICAL DATA: Cough and fever

EXAM:
CHEST  2 VIEW

[view not recorded (1 of 2)]
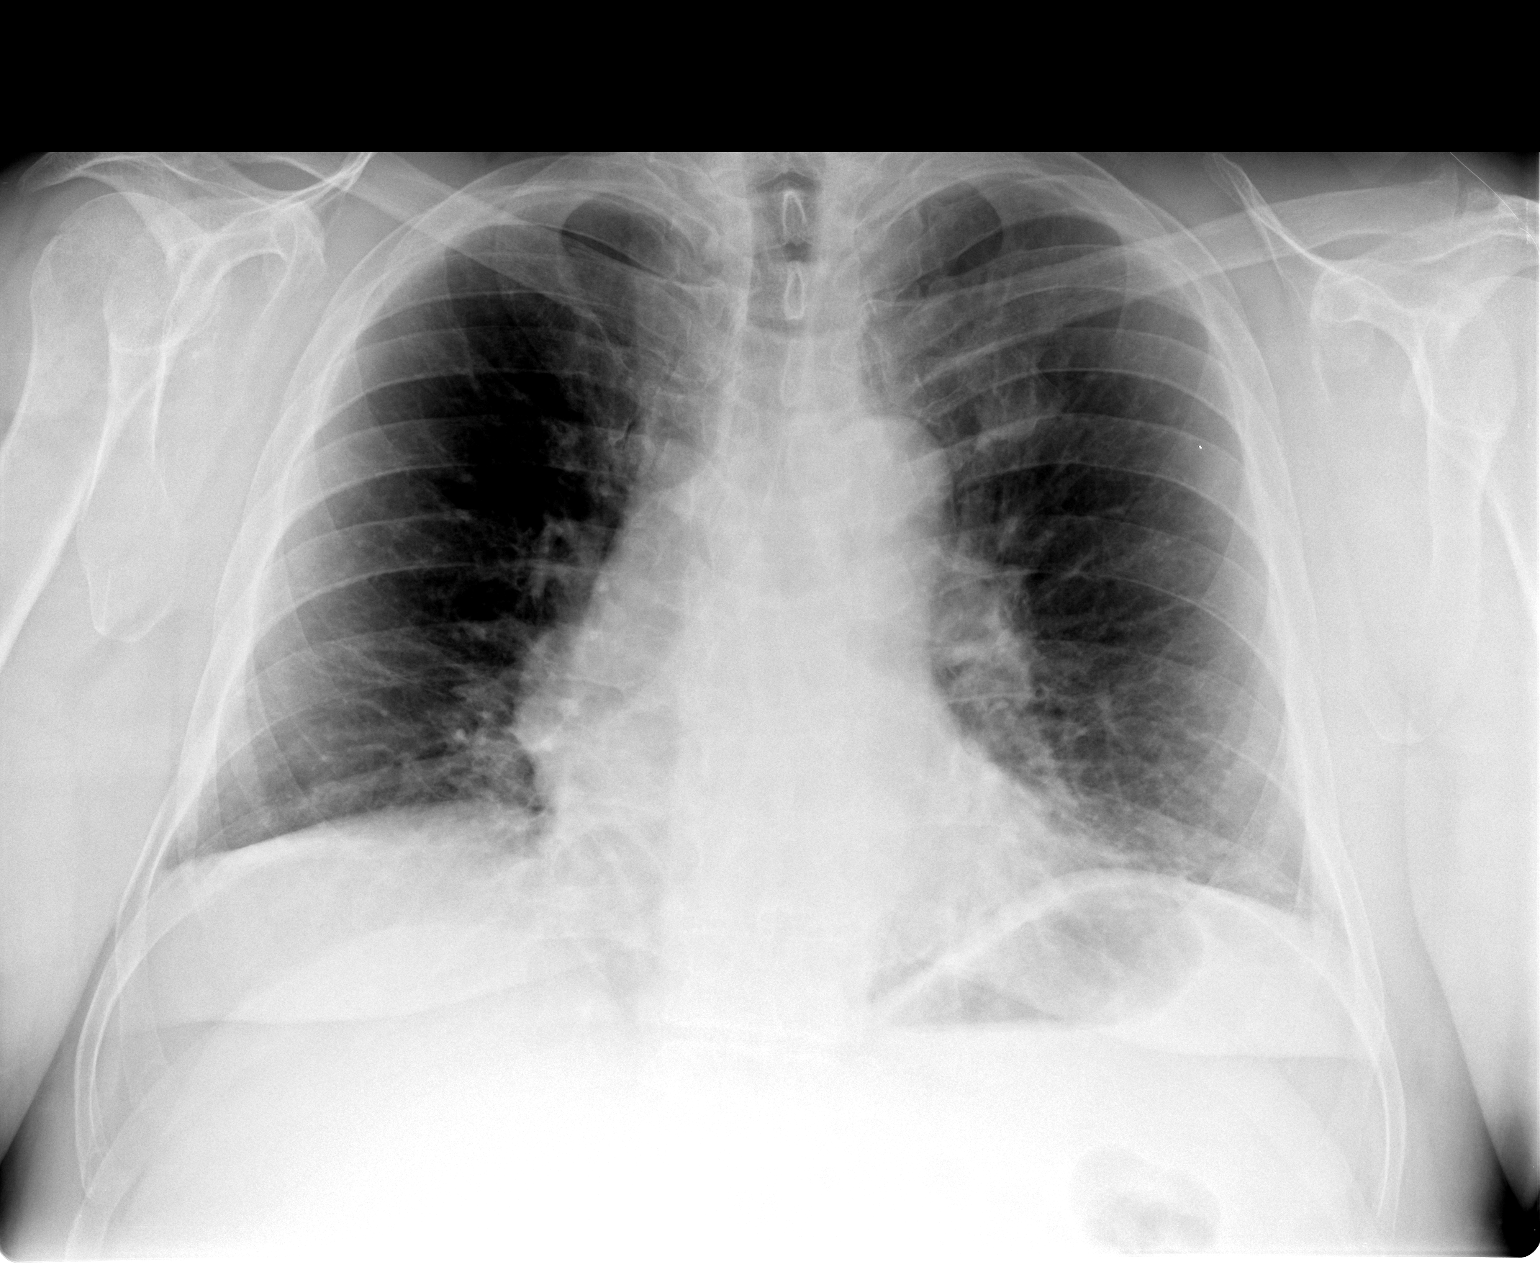

[view not recorded (2 of 2)]
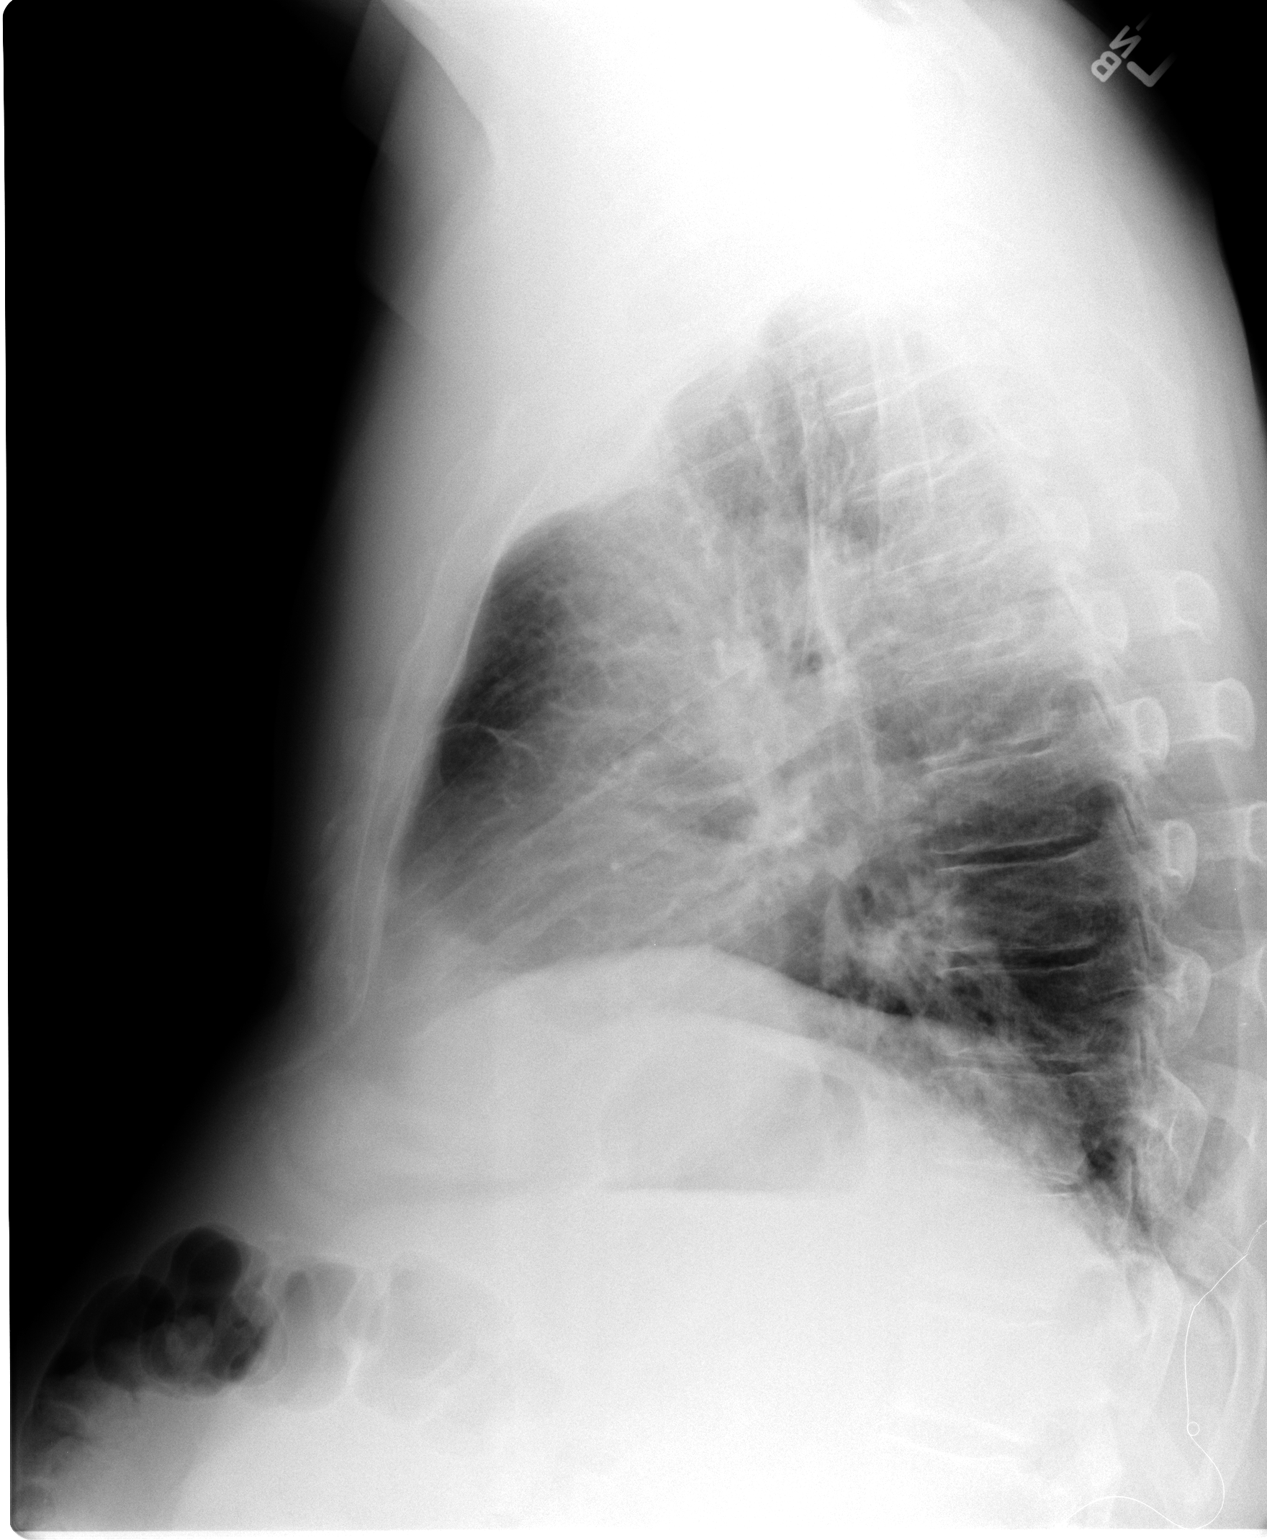

[2 of 2 positions shown; findings below may reference images not displayed]

FINDINGS: There is mild bibasilar lung scarring. There is no edema or
consolidation. The heart size and pulmonary vascularity are normal.
No adenopathy. No bone lesions.
IMPRESSION: No edema or consolidation.  Bibasilar scarring.

## 2015-03-01 DIAGNOSIS — Z79899 Other long term (current) drug therapy: Secondary | ICD-10-CM | POA: Diagnosis not present

## 2015-03-01 DIAGNOSIS — R51 Headache: Secondary | ICD-10-CM | POA: Diagnosis not present

## 2015-03-01 DIAGNOSIS — I10 Essential (primary) hypertension: Secondary | ICD-10-CM | POA: Diagnosis not present

## 2015-08-06 DIAGNOSIS — Z125 Encounter for screening for malignant neoplasm of prostate: Secondary | ICD-10-CM | POA: Diagnosis not present

## 2015-08-06 DIAGNOSIS — Z6830 Body mass index (BMI) 30.0-30.9, adult: Secondary | ICD-10-CM | POA: Diagnosis not present

## 2015-08-06 DIAGNOSIS — Z0001 Encounter for general adult medical examination with abnormal findings: Secondary | ICD-10-CM | POA: Diagnosis not present

## 2015-08-06 DIAGNOSIS — K219 Gastro-esophageal reflux disease without esophagitis: Secondary | ICD-10-CM | POA: Diagnosis not present

## 2015-08-06 DIAGNOSIS — Z0000 Encounter for general adult medical examination without abnormal findings: Secondary | ICD-10-CM | POA: Diagnosis not present

## 2015-08-06 DIAGNOSIS — E785 Hyperlipidemia, unspecified: Secondary | ICD-10-CM | POA: Diagnosis not present

## 2015-08-06 DIAGNOSIS — Z1389 Encounter for screening for other disorder: Secondary | ICD-10-CM | POA: Diagnosis not present

## 2015-08-06 DIAGNOSIS — Z23 Encounter for immunization: Secondary | ICD-10-CM | POA: Diagnosis not present

## 2015-08-06 DIAGNOSIS — R490 Dysphonia: Secondary | ICD-10-CM | POA: Diagnosis not present

## 2015-08-31 ENCOUNTER — Emergency Department (HOSPITAL_COMMUNITY): Payer: Medicare Other

## 2015-08-31 ENCOUNTER — Encounter (HOSPITAL_COMMUNITY): Payer: Self-pay | Admitting: Emergency Medicine

## 2015-08-31 ENCOUNTER — Emergency Department (HOSPITAL_COMMUNITY)
Admission: EM | Admit: 2015-08-31 | Discharge: 2015-08-31 | Disposition: A | Discharge status: Home / Self Care | Payer: Medicare Other | Attending: Emergency Medicine | Admitting: Emergency Medicine

## 2015-08-31 DIAGNOSIS — F329 Major depressive disorder, single episode, unspecified: Secondary | ICD-10-CM | POA: Insufficient documentation

## 2015-08-31 DIAGNOSIS — M545 Low back pain, unspecified: Secondary | ICD-10-CM

## 2015-08-31 DIAGNOSIS — S199XXA Unspecified injury of neck, initial encounter: Secondary | ICD-10-CM | POA: Diagnosis not present

## 2015-08-31 DIAGNOSIS — Z79899 Other long term (current) drug therapy: Secondary | ICD-10-CM | POA: Diagnosis not present

## 2015-08-31 DIAGNOSIS — M542 Cervicalgia: Secondary | ICD-10-CM | POA: Diagnosis not present

## 2015-08-31 DIAGNOSIS — T148 Other injury of unspecified body region: Secondary | ICD-10-CM | POA: Diagnosis not present

## 2015-08-31 DIAGNOSIS — S299XXA Unspecified injury of thorax, initial encounter: Secondary | ICD-10-CM | POA: Diagnosis not present

## 2015-08-31 DIAGNOSIS — S3992XA Unspecified injury of lower back, initial encounter: Secondary | ICD-10-CM | POA: Diagnosis not present

## 2015-08-31 MED ORDER — CYCLOBENZAPRINE HCL 10 MG PO TABS: 10.0000 mg | ORAL_TABLET | Freq: Three times a day (TID) | ORAL | Status: AC | PRN

## 2015-08-31 MED ORDER — DIAZEPAM 5 MG PO TABS
5.0000 mg | ORAL_TABLET | Freq: Once | ORAL | Status: AC
  Administered 2015-08-31: 5 mg via ORAL
  Filled 2015-08-31: qty 1

## 2015-08-31 MED ORDER — IBUPROFEN 400 MG PO TABS
600.0000 mg | ORAL_TABLET | Freq: Once | ORAL | Status: AC
  Administered 2015-08-31: 600 mg via ORAL
  Filled 2015-08-31: qty 2

## 2015-08-31 MED ORDER — IBUPROFEN 600 MG PO TABS: 600.0000 mg | ORAL_TABLET | Freq: Three times a day (TID) | ORAL | Status: AC | PRN

## 2015-08-31 MED ORDER — HYDROCODONE-ACETAMINOPHEN 5-325 MG PO TABS
1.0000 | ORAL_TABLET | Freq: Once | ORAL | Status: AC
  Administered 2015-08-31: 1 via ORAL
  Filled 2015-08-31: qty 1
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
## 2015-08-31 NOTE — Discharge Instructions (Signed)

## 2015-09-14 ENCOUNTER — Other Ambulatory Visit (HOSPITAL_COMMUNITY): Payer: Self-pay | Admitting: Internal Medicine

## 2015-09-14 DIAGNOSIS — M545 Low back pain: Secondary | ICD-10-CM | POA: Diagnosis not present

## 2015-09-14 DIAGNOSIS — M546 Pain in thoracic spine: Secondary | ICD-10-CM | POA: Diagnosis not present

## 2015-09-14 DIAGNOSIS — Z1389 Encounter for screening for other disorder: Secondary | ICD-10-CM | POA: Diagnosis not present

## 2015-09-14 DIAGNOSIS — G894 Chronic pain syndrome: Secondary | ICD-10-CM | POA: Diagnosis not present

## 2015-09-14 DIAGNOSIS — M5431 Sciatica, right side: Secondary | ICD-10-CM | POA: Diagnosis not present

## 2015-09-14 DIAGNOSIS — E663 Overweight: Secondary | ICD-10-CM | POA: Diagnosis not present

## 2015-09-14 DIAGNOSIS — M1991 Primary osteoarthritis, unspecified site: Secondary | ICD-10-CM | POA: Diagnosis not present

## 2015-09-14 DIAGNOSIS — Z6828 Body mass index (BMI) 28.0-28.9, adult: Secondary | ICD-10-CM | POA: Diagnosis not present

## 2015-09-17 ENCOUNTER — Other Ambulatory Visit (HOSPITAL_COMMUNITY): Payer: Self-pay | Admitting: Internal Medicine

## 2015-09-17 DIAGNOSIS — M5441 Lumbago with sciatica, right side: Secondary | ICD-10-CM

## 2015-09-20 DIAGNOSIS — M546 Pain in thoracic spine: Secondary | ICD-10-CM | POA: Diagnosis not present

## 2015-09-20 DIAGNOSIS — M25512 Pain in left shoulder: Secondary | ICD-10-CM | POA: Diagnosis not present

## 2015-09-20 DIAGNOSIS — M47816 Spondylosis without myelopathy or radiculopathy, lumbar region: Secondary | ICD-10-CM | POA: Diagnosis not present

## 2015-09-20 DIAGNOSIS — M47817 Spondylosis without myelopathy or radiculopathy, lumbosacral region: Secondary | ICD-10-CM | POA: Diagnosis not present

## 2015-09-20 DIAGNOSIS — M5126 Other intervertebral disc displacement, lumbar region: Secondary | ICD-10-CM | POA: Diagnosis not present

## 2015-09-20 DIAGNOSIS — M25511 Pain in right shoulder: Secondary | ICD-10-CM | POA: Diagnosis not present

## 2015-09-20 DIAGNOSIS — M5127 Other intervertebral disc displacement, lumbosacral region: Secondary | ICD-10-CM | POA: Diagnosis not present

## 2015-09-20 DIAGNOSIS — M4806 Spinal stenosis, lumbar region: Secondary | ICD-10-CM | POA: Diagnosis not present

## 2015-09-21 DIAGNOSIS — J209 Acute bronchitis, unspecified: Secondary | ICD-10-CM | POA: Diagnosis not present

## 2015-09-21 DIAGNOSIS — E663 Overweight: Secondary | ICD-10-CM | POA: Diagnosis not present

## 2015-09-21 DIAGNOSIS — J040 Acute laryngitis: Secondary | ICD-10-CM | POA: Diagnosis not present

## 2015-09-21 DIAGNOSIS — Z6827 Body mass index (BMI) 27.0-27.9, adult: Secondary | ICD-10-CM | POA: Diagnosis not present

## 2015-10-01 DIAGNOSIS — M9903 Segmental and somatic dysfunction of lumbar region: Secondary | ICD-10-CM | POA: Diagnosis not present

## 2015-10-01 DIAGNOSIS — S134XXA Sprain of ligaments of cervical spine, initial encounter: Secondary | ICD-10-CM | POA: Diagnosis not present

## 2015-10-01 DIAGNOSIS — M9902 Segmental and somatic dysfunction of thoracic region: Secondary | ICD-10-CM | POA: Diagnosis not present

## 2015-10-01 DIAGNOSIS — M9901 Segmental and somatic dysfunction of cervical region: Secondary | ICD-10-CM | POA: Diagnosis not present

## 2015-10-01 DIAGNOSIS — S233XXA Sprain of ligaments of thoracic spine, initial encounter: Secondary | ICD-10-CM | POA: Diagnosis not present

## 2015-10-01 DIAGNOSIS — S335XXA Sprain of ligaments of lumbar spine, initial encounter: Secondary | ICD-10-CM | POA: Diagnosis not present

## 2015-10-03 ENCOUNTER — Encounter (INDEPENDENT_AMBULATORY_CARE_PROVIDER_SITE_OTHER): Payer: Self-pay | Admitting: *Deleted

## 2015-10-03 DIAGNOSIS — S233XXA Sprain of ligaments of thoracic spine, initial encounter: Secondary | ICD-10-CM | POA: Diagnosis not present

## 2015-10-03 DIAGNOSIS — M9903 Segmental and somatic dysfunction of lumbar region: Secondary | ICD-10-CM | POA: Diagnosis not present

## 2015-10-03 DIAGNOSIS — M9902 Segmental and somatic dysfunction of thoracic region: Secondary | ICD-10-CM | POA: Diagnosis not present

## 2015-10-03 DIAGNOSIS — S335XXA Sprain of ligaments of lumbar spine, initial encounter: Secondary | ICD-10-CM | POA: Diagnosis not present

## 2015-10-03 DIAGNOSIS — M9901 Segmental and somatic dysfunction of cervical region: Secondary | ICD-10-CM | POA: Diagnosis not present

## 2015-10-03 DIAGNOSIS — S134XXA Sprain of ligaments of cervical spine, initial encounter: Secondary | ICD-10-CM | POA: Diagnosis not present

## 2015-10-04 DIAGNOSIS — M9901 Segmental and somatic dysfunction of cervical region: Secondary | ICD-10-CM | POA: Diagnosis not present

## 2015-10-04 DIAGNOSIS — S335XXA Sprain of ligaments of lumbar spine, initial encounter: Secondary | ICD-10-CM | POA: Diagnosis not present

## 2015-10-04 DIAGNOSIS — M9902 Segmental and somatic dysfunction of thoracic region: Secondary | ICD-10-CM | POA: Diagnosis not present

## 2015-10-04 DIAGNOSIS — S134XXA Sprain of ligaments of cervical spine, initial encounter: Secondary | ICD-10-CM | POA: Diagnosis not present

## 2015-10-04 DIAGNOSIS — S233XXA Sprain of ligaments of thoracic spine, initial encounter: Secondary | ICD-10-CM | POA: Diagnosis not present

## 2015-10-04 DIAGNOSIS — M9903 Segmental and somatic dysfunction of lumbar region: Secondary | ICD-10-CM | POA: Diagnosis not present

## 2015-10-08 DIAGNOSIS — M9901 Segmental and somatic dysfunction of cervical region: Secondary | ICD-10-CM | POA: Diagnosis not present

## 2015-10-08 DIAGNOSIS — M9902 Segmental and somatic dysfunction of thoracic region: Secondary | ICD-10-CM | POA: Diagnosis not present

## 2015-10-08 DIAGNOSIS — M9903 Segmental and somatic dysfunction of lumbar region: Secondary | ICD-10-CM | POA: Diagnosis not present

## 2015-10-08 DIAGNOSIS — S335XXA Sprain of ligaments of lumbar spine, initial encounter: Secondary | ICD-10-CM | POA: Diagnosis not present

## 2015-10-08 DIAGNOSIS — S233XXA Sprain of ligaments of thoracic spine, initial encounter: Secondary | ICD-10-CM | POA: Diagnosis not present

## 2015-10-08 DIAGNOSIS — S134XXA Sprain of ligaments of cervical spine, initial encounter: Secondary | ICD-10-CM | POA: Diagnosis not present

## 2015-10-09 DIAGNOSIS — S233XXA Sprain of ligaments of thoracic spine, initial encounter: Secondary | ICD-10-CM | POA: Diagnosis not present

## 2015-10-09 DIAGNOSIS — S335XXA Sprain of ligaments of lumbar spine, initial encounter: Secondary | ICD-10-CM | POA: Diagnosis not present

## 2015-10-09 DIAGNOSIS — M9902 Segmental and somatic dysfunction of thoracic region: Secondary | ICD-10-CM | POA: Diagnosis not present

## 2015-10-09 DIAGNOSIS — S134XXA Sprain of ligaments of cervical spine, initial encounter: Secondary | ICD-10-CM | POA: Diagnosis not present

## 2015-10-09 DIAGNOSIS — M9901 Segmental and somatic dysfunction of cervical region: Secondary | ICD-10-CM | POA: Diagnosis not present

## 2015-10-09 DIAGNOSIS — M9903 Segmental and somatic dysfunction of lumbar region: Secondary | ICD-10-CM | POA: Diagnosis not present

## 2015-10-12 DIAGNOSIS — M9903 Segmental and somatic dysfunction of lumbar region: Secondary | ICD-10-CM | POA: Diagnosis not present

## 2015-10-12 DIAGNOSIS — M9901 Segmental and somatic dysfunction of cervical region: Secondary | ICD-10-CM | POA: Diagnosis not present

## 2015-10-12 DIAGNOSIS — M9902 Segmental and somatic dysfunction of thoracic region: Secondary | ICD-10-CM | POA: Diagnosis not present

## 2015-10-12 DIAGNOSIS — S134XXA Sprain of ligaments of cervical spine, initial encounter: Secondary | ICD-10-CM | POA: Diagnosis not present

## 2015-10-12 DIAGNOSIS — S335XXA Sprain of ligaments of lumbar spine, initial encounter: Secondary | ICD-10-CM | POA: Diagnosis not present

## 2015-10-12 DIAGNOSIS — S233XXA Sprain of ligaments of thoracic spine, initial encounter: Secondary | ICD-10-CM | POA: Diagnosis not present

## 2015-10-17 DIAGNOSIS — M9901 Segmental and somatic dysfunction of cervical region: Secondary | ICD-10-CM | POA: Diagnosis not present

## 2015-10-17 DIAGNOSIS — M9903 Segmental and somatic dysfunction of lumbar region: Secondary | ICD-10-CM | POA: Diagnosis not present

## 2015-10-17 DIAGNOSIS — S335XXA Sprain of ligaments of lumbar spine, initial encounter: Secondary | ICD-10-CM | POA: Diagnosis not present

## 2015-10-17 DIAGNOSIS — S134XXA Sprain of ligaments of cervical spine, initial encounter: Secondary | ICD-10-CM | POA: Diagnosis not present

## 2015-10-17 DIAGNOSIS — S233XXA Sprain of ligaments of thoracic spine, initial encounter: Secondary | ICD-10-CM | POA: Diagnosis not present

## 2015-10-17 DIAGNOSIS — M9902 Segmental and somatic dysfunction of thoracic region: Secondary | ICD-10-CM | POA: Diagnosis not present

## 2015-10-23 DIAGNOSIS — M9902 Segmental and somatic dysfunction of thoracic region: Secondary | ICD-10-CM | POA: Diagnosis not present

## 2015-10-23 DIAGNOSIS — S134XXA Sprain of ligaments of cervical spine, initial encounter: Secondary | ICD-10-CM | POA: Diagnosis not present

## 2015-10-23 DIAGNOSIS — M9903 Segmental and somatic dysfunction of lumbar region: Secondary | ICD-10-CM | POA: Diagnosis not present

## 2015-10-23 DIAGNOSIS — M9901 Segmental and somatic dysfunction of cervical region: Secondary | ICD-10-CM | POA: Diagnosis not present

## 2015-10-23 DIAGNOSIS — S335XXA Sprain of ligaments of lumbar spine, initial encounter: Secondary | ICD-10-CM | POA: Diagnosis not present

## 2015-10-23 DIAGNOSIS — S233XXA Sprain of ligaments of thoracic spine, initial encounter: Secondary | ICD-10-CM | POA: Diagnosis not present

## 2015-10-25 DIAGNOSIS — S233XXA Sprain of ligaments of thoracic spine, initial encounter: Secondary | ICD-10-CM | POA: Diagnosis not present

## 2015-10-25 DIAGNOSIS — M9901 Segmental and somatic dysfunction of cervical region: Secondary | ICD-10-CM | POA: Diagnosis not present

## 2015-10-25 DIAGNOSIS — M9902 Segmental and somatic dysfunction of thoracic region: Secondary | ICD-10-CM | POA: Diagnosis not present

## 2015-10-25 DIAGNOSIS — S335XXA Sprain of ligaments of lumbar spine, initial encounter: Secondary | ICD-10-CM | POA: Diagnosis not present

## 2015-10-25 DIAGNOSIS — S134XXA Sprain of ligaments of cervical spine, initial encounter: Secondary | ICD-10-CM | POA: Diagnosis not present

## 2015-10-25 DIAGNOSIS — M9903 Segmental and somatic dysfunction of lumbar region: Secondary | ICD-10-CM | POA: Diagnosis not present

## 2015-10-30 DIAGNOSIS — S335XXA Sprain of ligaments of lumbar spine, initial encounter: Secondary | ICD-10-CM | POA: Diagnosis not present

## 2015-10-30 DIAGNOSIS — M9903 Segmental and somatic dysfunction of lumbar region: Secondary | ICD-10-CM | POA: Diagnosis not present

## 2015-10-30 DIAGNOSIS — M9901 Segmental and somatic dysfunction of cervical region: Secondary | ICD-10-CM | POA: Diagnosis not present

## 2015-10-30 DIAGNOSIS — M9902 Segmental and somatic dysfunction of thoracic region: Secondary | ICD-10-CM | POA: Diagnosis not present

## 2015-10-30 DIAGNOSIS — S134XXA Sprain of ligaments of cervical spine, initial encounter: Secondary | ICD-10-CM | POA: Diagnosis not present

## 2015-10-30 DIAGNOSIS — S233XXA Sprain of ligaments of thoracic spine, initial encounter: Secondary | ICD-10-CM | POA: Diagnosis not present

## 2015-11-06 DIAGNOSIS — M9901 Segmental and somatic dysfunction of cervical region: Secondary | ICD-10-CM | POA: Diagnosis not present

## 2015-11-06 DIAGNOSIS — S233XXA Sprain of ligaments of thoracic spine, initial encounter: Secondary | ICD-10-CM | POA: Diagnosis not present

## 2015-11-06 DIAGNOSIS — S134XXA Sprain of ligaments of cervical spine, initial encounter: Secondary | ICD-10-CM | POA: Diagnosis not present

## 2015-11-06 DIAGNOSIS — M9903 Segmental and somatic dysfunction of lumbar region: Secondary | ICD-10-CM | POA: Diagnosis not present

## 2015-11-06 DIAGNOSIS — M9902 Segmental and somatic dysfunction of thoracic region: Secondary | ICD-10-CM | POA: Diagnosis not present

## 2015-11-06 DIAGNOSIS — S335XXA Sprain of ligaments of lumbar spine, initial encounter: Secondary | ICD-10-CM | POA: Diagnosis not present

## 2015-11-16 ENCOUNTER — Ambulatory Visit (INDEPENDENT_AMBULATORY_CARE_PROVIDER_SITE_OTHER): Payer: Medicare Other

## 2015-11-16 ENCOUNTER — Encounter: Payer: Self-pay | Admitting: Podiatry

## 2015-11-16 ENCOUNTER — Ambulatory Visit (INDEPENDENT_AMBULATORY_CARE_PROVIDER_SITE_OTHER): Payer: Medicare Other | Admitting: Podiatry

## 2015-11-16 VITALS — BP 154/88 | HR 86 | Resp 12

## 2015-11-16 DIAGNOSIS — M722 Plantar fascial fibromatosis: Secondary | ICD-10-CM | POA: Diagnosis not present

## 2015-11-16 DIAGNOSIS — M79671 Pain in right foot: Secondary | ICD-10-CM

## 2015-11-16 DIAGNOSIS — L600 Ingrowing nail: Secondary | ICD-10-CM | POA: Diagnosis not present

## 2015-11-16 MED ORDER — TRIAMCINOLONE ACETONIDE 10 MG/ML IJ SUSP
10.0000 mg | Freq: Once | INTRAMUSCULAR | Status: AC
  Administered 2015-11-16: 10 mg

## 2015-11-16 NOTE — Progress Notes (Signed)
Subjective:     Patient ID: Jesse Yu, male   DOB: [birth year], 70 y.o.   MRN: [id number]  HPI patient states I've had a lot of problems with my right heel and this left nail gives me trouble also patient states the right heels been at least 3 months and the nails been a number of years   Review of Systems  All other systems reviewed and are negative.      Objective:   Physical Exam  Constitutional: He is oriented to person, place, and time.  Cardiovascular: Intact distal pulses.   Musculoskeletal: Normal range of motion.  Neurological: He is oriented to person, place, and time.  Skin: Skin is warm.  Nursing note and vitals reviewed.  neurovascular status found to be intact with muscle strength adequate range of motion within normal limits with patient found to have exquisite discomfort plantar aspect right heel at the insertional point tendon into the calcaneus with a damaged left hallux nail with several spicules that are painful when pressed and growing abnormally. Patient's found have good digital perfusion and is well oriented 3     Assessment:     Acute plantar fasciitis right with inflammation and damage hallux nail left with pain    Plan:     H&P and conditions reviewed. For the right heel I injected the plantar fascia 3 mg Kenalog 5 mg Xylocaine and applied fascial brace with instructions on usage. For the left I recommended nail removal discussing procedure and risk and today I infiltrated the left hallux 60 mg Xylocaine Marcaine mixture remove the hallux nail borders all abnormal tissue and applied phenol 6 applications 30 seconds followed by alcohol lavage and sterile dressing. Gave instructions on soaks and reappoint  X-ray report indicates that there is spur formation with no indications of stress fracture arthritis

## 2015-11-16 NOTE — Progress Notes (Signed)
   Subjective:    Patient ID: Jesse Yu, male    DOB: [birth year], 70 y.o.   MRN: [id number]  HPI  Chief Complaint  Patient presents with  . Plantar Fasciitis    ''RT BOTTOM OF THE HEEL IS PAINFUL.''   PT STATED RT BOTTOM OF THE HEEL IS BEEN PAINFUL FOR 4 MONTHS. FOOT IS WORSE ESPECIALLY WHEN PUTTING PRESSURE ON IT. TRIED ANTI  IMFLAMMATORY-NO RELIEF.   Review of Systems  Musculoskeletal: Positive for gait problem.       Objective:   Physical Exam        Assessment & Plan:

## 2015-11-16 NOTE — Patient Instructions (Addendum)
Plantar Fasciitis Plantar fasciitis is a painful foot condition that affects the heel. It occurs when the band of tissue that connects the toes to the heel bone (plantar fascia) becomes irritated. This can happen after exercising too much or doing other repetitive activities (overuse injury). The pain from plantar fasciitis can range from mild irritation to severe pain that makes it difficult for you to walk or move. The pain is usually worse in the morning or after you have been sitting or lying down for a while. CAUSES This condition may be caused by:  Standing for long periods of time.  Wearing shoes that do not fit.  Doing high-impact activities, including running, aerobics, and ballet.  Being overweight.  Having an abnormal way of walking (gait).  Having tight calf muscles.  Having high arches in your feet.  Starting a new athletic activity. SYMPTOMS The main symptom of this condition is heel pain. Other symptoms include: 1. Pain that gets worse after activity or exercise. 2. Pain that is worse in the morning or after resting. 3. Pain that goes away after you walk for a few minutes. DIAGNOSIS This condition may be diagnosed based on your signs and symptoms. Your health care provider will also do a physical exam to check for:  A tender area on the bottom of your foot.  A high arch in your foot.  Pain when you move your foot.  Difficulty moving your foot. You may also need to have imaging studies to confirm the diagnosis. These can include:  X-rays.  Ultrasound.  MRI. TREATMENT  Treatment for plantar fasciitis depends on the severity of the condition. Your treatment may include:  Rest, ice, and over-the-counter pain medicines to manage your pain.  Exercises to stretch your calves and your plantar fascia.  A splint that holds your foot in a stretched, upward position while you sleep (night splint).  Physical therapy to relieve symptoms and prevent problems in the  future.  Cortisone injections to relieve severe pain.  Extracorporeal shock wave therapy (ESWT) to stimulate damaged plantar fascia with electrical impulses. It is often used as a last resort before surgery.  Surgery, if other treatments have not worked after 12 months. HOME CARE INSTRUCTIONS  Take medicines only as directed by your health care provider.  Avoid activities that cause pain.  Roll the bottom of your foot over a bag of ice or a bottle of cold water. Do this for 20 minutes, 3-4 times a day.  Perform simple stretches as directed by your health care provider.  Try wearing athletic shoes with air-sole or gel-sole cushions or soft shoe inserts.  Wear a night splint while sleeping, if directed by your health care provider.  Keep all follow-up appointments with your health care provider. PREVENTION   Do not perform exercises or activities that cause heel pain.  Consider finding low-impact activities if you continue to have problems.  Lose weight if you need to. The best way to prevent plantar fasciitis is to avoid the activities that aggravate your plantar fascia. SEEK MEDICAL CARE IF:  Your symptoms do not go away after treatment with home care measures.  Your pain gets worse.  Your pain affects your ability to move or do your daily activities.   This information is not intended to replace advice given to you by your health care provider. Make sure you discuss any questions you have with your health care provider.   Document Released: 01/28/2001 Document Revised: 01/24/2015 Document Reviewed: 03/15/2014 Elsevier  Interactive Patient Education 2016 [employer] ANTIBACTERIAL SOAP INSTRUCTIONS  THE DAY AFTER PROCEDURE  Please follow the instructions your doctor has marked.   Shower as usual. Before getting out, place a drop of antibacterial liquid soap (Dial) on a wet, clean washcloth.  Gently wipe washcloth over affected area.  Afterward, rinse the area with  warm water.  Blot the area dry with a soft cloth and cover with antibiotic ointment (neosporin, polysporin, bacitracin) and band aid or gauze and tape  Place 3-4 drops of antibacterial liquid soap in a quart of warm tap water.  Submerge foot into water for 20 minutes.  If bandage was applied after your procedure, leave on to allow for easy lift off, then remove and continue with soak for the remaining time.  Next, blot area dry with a soft cloth and cover with a bandage.  Apply other medications as directed by your doctor, such as cortisporin otic solution (eardrops) or neosporin antibiotic ointment

## 2015-11-23 ENCOUNTER — Encounter: Payer: Self-pay | Admitting: Podiatry

## 2015-11-23 ENCOUNTER — Ambulatory Visit (INDEPENDENT_AMBULATORY_CARE_PROVIDER_SITE_OTHER): Payer: Medicare Other | Admitting: Podiatry

## 2015-11-23 VITALS — BP 141/88 | HR 69 | Resp 12

## 2015-11-23 DIAGNOSIS — M722 Plantar fascial fibromatosis: Secondary | ICD-10-CM

## 2015-11-23 MED ORDER — TRIAMCINOLONE ACETONIDE 10 MG/ML IJ SUSP
10.0000 mg | Freq: Once | INTRAMUSCULAR | Status: AC
  Administered 2015-11-23: 10 mg

## 2015-11-23 NOTE — Patient Instructions (Signed)

## 2015-11-25 NOTE — Progress Notes (Signed)
Subjective:     Patient ID: Jesse Yu, male   DOB: 02/14/1946, 70 y.o.   MRN: [id number]  HPI patient states she still having a lot of pain in his right heel and it hurts worse when he gets up in the morning after sitting and the nail on the left is healing well   Review of Systems     Objective:   Physical Exam Neurovascular status intact muscle strength is adequate with patient noted to have well-healing surgical site left hallux were nail was removed and right plantar fascial pain at the insertion    Assessment:     Continue plantar fasciitis right along with improved nail left    Plan:     Reviewed condition and continued soaks of the left along with bandage usage and for the right I reinjected 3 mg Kenalog 5 mg Xylocaine and went ahead and applied night splint with all instructions on usage

## 2015-12-21 ENCOUNTER — Ambulatory Visit: Payer: Medicare Other | Admitting: Podiatry

## 2016-01-10 DIAGNOSIS — Z1389 Encounter for screening for other disorder: Secondary | ICD-10-CM | POA: Diagnosis not present

## 2016-01-10 DIAGNOSIS — R634 Abnormal weight loss: Secondary | ICD-10-CM | POA: Diagnosis not present

## 2016-01-10 DIAGNOSIS — G894 Chronic pain syndrome: Secondary | ICD-10-CM | POA: Diagnosis not present

## 2016-01-10 DIAGNOSIS — Z6828 Body mass index (BMI) 28.0-28.9, adult: Secondary | ICD-10-CM | POA: Diagnosis not present

## 2016-03-14 ENCOUNTER — Other Ambulatory Visit (HOSPITAL_COMMUNITY): Payer: Self-pay | Admitting: Family Medicine

## 2016-03-14 ENCOUNTER — Ambulatory Visit (HOSPITAL_COMMUNITY)
Admission: RE | Admit: 2016-03-14 | Discharge: 2016-03-14 | Disposition: A | Discharge status: Home / Self Care | Payer: Medicare Other | Source: Ambulatory Visit | Attending: Family Medicine | Admitting: Family Medicine

## 2016-03-14 DIAGNOSIS — R52 Pain, unspecified: Secondary | ICD-10-CM

## 2016-03-14 DIAGNOSIS — M79652 Pain in left thigh: Secondary | ICD-10-CM | POA: Insufficient documentation

## 2016-03-14 DIAGNOSIS — Z1389 Encounter for screening for other disorder: Secondary | ICD-10-CM | POA: Insufficient documentation

## 2016-03-14 DIAGNOSIS — Z6827 Body mass index (BMI) 27.0-27.9, adult: Secondary | ICD-10-CM | POA: Insufficient documentation

## 2016-03-14 DIAGNOSIS — E663 Overweight: Secondary | ICD-10-CM | POA: Diagnosis not present

## 2016-03-14 DIAGNOSIS — M79605 Pain in left leg: Secondary | ICD-10-CM | POA: Diagnosis not present

## 2016-05-14 DIAGNOSIS — E663 Overweight: Secondary | ICD-10-CM | POA: Diagnosis not present

## 2016-05-14 DIAGNOSIS — Z6828 Body mass index (BMI) 28.0-28.9, adult: Secondary | ICD-10-CM | POA: Diagnosis not present

## 2016-05-14 DIAGNOSIS — Z1389 Encounter for screening for other disorder: Secondary | ICD-10-CM | POA: Diagnosis not present

## 2016-05-14 DIAGNOSIS — E669 Obesity, unspecified: Secondary | ICD-10-CM | POA: Diagnosis not present

## 2016-05-14 DIAGNOSIS — I5022 Chronic systolic (congestive) heart failure: Secondary | ICD-10-CM | POA: Diagnosis not present

## 2016-06-16 DIAGNOSIS — E669 Obesity, unspecified: Secondary | ICD-10-CM | POA: Diagnosis not present

## 2016-06-16 DIAGNOSIS — E663 Overweight: Secondary | ICD-10-CM | POA: Diagnosis not present

## 2016-06-16 DIAGNOSIS — Z6828 Body mass index (BMI) 28.0-28.9, adult: Secondary | ICD-10-CM | POA: Diagnosis not present

## 2016-06-16 DIAGNOSIS — Z1389 Encounter for screening for other disorder: Secondary | ICD-10-CM | POA: Diagnosis not present

## 2017-01-07 DIAGNOSIS — Z6828 Body mass index (BMI) 28.0-28.9, adult: Secondary | ICD-10-CM | POA: Diagnosis not present

## 2017-01-07 DIAGNOSIS — I5022 Chronic systolic (congestive) heart failure: Secondary | ICD-10-CM | POA: Diagnosis not present

## 2017-01-07 DIAGNOSIS — M1991 Primary osteoarthritis, unspecified site: Secondary | ICD-10-CM | POA: Diagnosis not present

## 2017-01-07 DIAGNOSIS — E785 Hyperlipidemia, unspecified: Secondary | ICD-10-CM | POA: Diagnosis not present

## 2017-01-07 DIAGNOSIS — Z125 Encounter for screening for malignant neoplasm of prostate: Secondary | ICD-10-CM | POA: Diagnosis not present

## 2017-01-07 DIAGNOSIS — G894 Chronic pain syndrome: Secondary | ICD-10-CM | POA: Diagnosis not present

## 2017-01-07 DIAGNOSIS — Z0000 Encounter for general adult medical examination without abnormal findings: Secondary | ICD-10-CM | POA: Diagnosis not present

## 2017-07-23 DIAGNOSIS — I5022 Chronic systolic (congestive) heart failure: Secondary | ICD-10-CM | POA: Diagnosis not present

## 2017-07-23 DIAGNOSIS — Z6828 Body mass index (BMI) 28.0-28.9, adult: Secondary | ICD-10-CM | POA: Diagnosis not present

## 2017-07-23 DIAGNOSIS — E669 Obesity, unspecified: Secondary | ICD-10-CM | POA: Diagnosis not present

## 2017-07-23 DIAGNOSIS — J45909 Unspecified asthma, uncomplicated: Secondary | ICD-10-CM | POA: Diagnosis not present

## 2017-07-23 DIAGNOSIS — E663 Overweight: Secondary | ICD-10-CM | POA: Diagnosis not present

## 2017-07-23 DIAGNOSIS — Z1389 Encounter for screening for other disorder: Secondary | ICD-10-CM | POA: Diagnosis not present

## 2017-07-23 DIAGNOSIS — M1991 Primary osteoarthritis, unspecified site: Secondary | ICD-10-CM | POA: Diagnosis not present

## 2017-08-12 DIAGNOSIS — E663 Overweight: Secondary | ICD-10-CM | POA: Diagnosis not present

## 2017-08-12 DIAGNOSIS — I5022 Chronic systolic (congestive) heart failure: Secondary | ICD-10-CM | POA: Diagnosis not present

## 2017-08-12 DIAGNOSIS — Z1389 Encounter for screening for other disorder: Secondary | ICD-10-CM | POA: Diagnosis not present

## 2017-08-12 DIAGNOSIS — J329 Chronic sinusitis, unspecified: Secondary | ICD-10-CM | POA: Diagnosis not present

## 2017-08-12 DIAGNOSIS — J309 Allergic rhinitis, unspecified: Secondary | ICD-10-CM | POA: Diagnosis not present

## 2017-08-12 DIAGNOSIS — Z6829 Body mass index (BMI) 29.0-29.9, adult: Secondary | ICD-10-CM | POA: Diagnosis not present

## 2017-09-28 DIAGNOSIS — R05 Cough: Secondary | ICD-10-CM | POA: Diagnosis not present

## 2017-09-28 DIAGNOSIS — Z6829 Body mass index (BMI) 29.0-29.9, adult: Secondary | ICD-10-CM | POA: Diagnosis not present

## 2017-09-28 DIAGNOSIS — E663 Overweight: Secondary | ICD-10-CM | POA: Diagnosis not present

## 2017-09-28 DIAGNOSIS — Z1389 Encounter for screening for other disorder: Secondary | ICD-10-CM | POA: Diagnosis not present

## 2018-01-08 DIAGNOSIS — Z0000 Encounter for general adult medical examination without abnormal findings: Secondary | ICD-10-CM | POA: Diagnosis not present

## 2018-01-08 DIAGNOSIS — Z1389 Encounter for screening for other disorder: Secondary | ICD-10-CM | POA: Diagnosis not present

## 2018-01-08 DIAGNOSIS — Z0001 Encounter for general adult medical examination with abnormal findings: Secondary | ICD-10-CM | POA: Diagnosis not present

## 2018-01-08 DIAGNOSIS — M179 Osteoarthritis of knee, unspecified: Secondary | ICD-10-CM | POA: Diagnosis not present

## 2018-01-08 DIAGNOSIS — E663 Overweight: Secondary | ICD-10-CM | POA: Diagnosis not present

## 2018-01-08 DIAGNOSIS — M1991 Primary osteoarthritis, unspecified site: Secondary | ICD-10-CM | POA: Diagnosis not present

## 2018-01-08 DIAGNOSIS — G894 Chronic pain syndrome: Secondary | ICD-10-CM | POA: Diagnosis not present

## 2018-01-08 DIAGNOSIS — Z6827 Body mass index (BMI) 27.0-27.9, adult: Secondary | ICD-10-CM | POA: Diagnosis not present

## 2018-06-21 ENCOUNTER — Other Ambulatory Visit (HOSPITAL_COMMUNITY): Payer: Self-pay | Admitting: Internal Medicine

## 2018-06-21 ENCOUNTER — Ambulatory Visit (HOSPITAL_COMMUNITY)
Admission: RE | Admit: 2018-06-21 | Discharge: 2018-06-21 | Disposition: A | Discharge status: Home / Self Care | Payer: Medicare Other | Source: Ambulatory Visit | Attending: Internal Medicine | Admitting: Internal Medicine

## 2018-06-21 DIAGNOSIS — T1591XA Foreign body on external eye, part unspecified, right eye, initial encounter: Secondary | ICD-10-CM | POA: Diagnosis not present

## 2018-06-21 DIAGNOSIS — M1991 Primary osteoarthritis, unspecified site: Secondary | ICD-10-CM | POA: Diagnosis not present

## 2018-06-21 DIAGNOSIS — S59902A Unspecified injury of left elbow, initial encounter: Secondary | ICD-10-CM | POA: Diagnosis not present

## 2018-06-21 DIAGNOSIS — S5002XA Contusion of left elbow, initial encounter: Secondary | ICD-10-CM | POA: Diagnosis not present

## 2018-06-21 DIAGNOSIS — I5022 Chronic systolic (congestive) heart failure: Secondary | ICD-10-CM | POA: Diagnosis not present

## 2018-06-21 DIAGNOSIS — S59902D Unspecified injury of left elbow, subsequent encounter: Secondary | ICD-10-CM

## 2018-06-21 DIAGNOSIS — Z6830 Body mass index (BMI) 30.0-30.9, adult: Secondary | ICD-10-CM | POA: Diagnosis not present

## 2018-06-21 DIAGNOSIS — M25522 Pain in left elbow: Secondary | ICD-10-CM | POA: Diagnosis not present

## 2019-01-07 DIAGNOSIS — E669 Obesity, unspecified: Secondary | ICD-10-CM | POA: Diagnosis not present

## 2019-01-31 ENCOUNTER — Emergency Department (HOSPITAL_COMMUNITY)
Admission: EM | Admit: 2019-01-31 | Discharge: 2019-01-31 | Disposition: A | Discharge status: Home / Self Care | Payer: No Typology Code available for payment source | Attending: Emergency Medicine | Admitting: Emergency Medicine

## 2019-01-31 ENCOUNTER — Other Ambulatory Visit: Payer: Self-pay

## 2019-01-31 ENCOUNTER — Encounter (HOSPITAL_COMMUNITY): Payer: Self-pay | Admitting: Emergency Medicine

## 2019-01-31 DIAGNOSIS — M1000 Idiopathic gout, unspecified site: Secondary | ICD-10-CM | POA: Insufficient documentation

## 2019-01-31 DIAGNOSIS — M79641 Pain in right hand: Secondary | ICD-10-CM | POA: Diagnosis present

## 2019-01-31 DIAGNOSIS — Z79899 Other long term (current) drug therapy: Secondary | ICD-10-CM | POA: Insufficient documentation

## 2019-01-31 HISTORY — DX: Gout, unspecified: M10.9

## 2019-01-31 MED ORDER — HYDROCODONE-ACETAMINOPHEN 5-325 MG PO TABS: 1.0000 | ORAL_TABLET | Freq: Four times a day (QID) | ORAL | 0 refills | Status: DC | PRN

## 2019-01-31 MED ORDER — PREDNISONE 10 MG PO TABS: 20.0000 mg | ORAL_TABLET | Freq: Every day | ORAL | 0 refills | Status: DC

## 2019-01-31 MED ORDER — HYDROCODONE-ACETAMINOPHEN 5-325 MG PO TABS
1.0000 | ORAL_TABLET | Freq: Once | ORAL | Status: AC
  Administered 2019-01-31: 08:00:00 1 via ORAL
  Filled 2019-01-31: qty 1

## 2019-01-31 MED ORDER — METHYLPREDNISOLONE SODIUM SUCC 125 MG IJ SOLR
125.0000 mg | Freq: Once | INTRAMUSCULAR | Status: AC
  Administered 2019-01-31: 08:00:00 125 mg via INTRAMUSCULAR
  Filled 2019-01-31: qty 2

## 2019-01-31 NOTE — Discharge Instructions (Addendum)
Follow-up with your doctor at the [facility] next week

## 2019-01-31 NOTE — ED Notes (Signed)
Pt came out into the hallway during shift change and became belligerent with staff because he had not been seen and we were "all at the desk talking". Explained we were in the middle of shift change and his new nurse would be in shortly. Pt then walked up the hallway to the doctor's area and began yelling at the doctor and the nurse who was leaving. Security was called and pt decided to go back into his room to wait and be seen.
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
## 2019-01-31 NOTE — ED Triage Notes (Signed)
Pt with c/o L hand pain. States he thinks it is his gout.

## 2019-03-14 DIAGNOSIS — E7489 Other specified disorders of carbohydrate metabolism: Secondary | ICD-10-CM | POA: Diagnosis not present

## 2019-03-14 DIAGNOSIS — M109 Gout, unspecified: Secondary | ICD-10-CM | POA: Diagnosis not present

## 2019-03-14 DIAGNOSIS — S61432A Puncture wound without foreign body of left hand, initial encounter: Secondary | ICD-10-CM | POA: Diagnosis not present

## 2019-03-14 DIAGNOSIS — Z1389 Encounter for screening for other disorder: Secondary | ICD-10-CM | POA: Diagnosis not present

## 2019-03-14 DIAGNOSIS — M779 Enthesopathy, unspecified: Secondary | ICD-10-CM | POA: Diagnosis not present

## 2019-03-14 DIAGNOSIS — E6609 Other obesity due to excess calories: Secondary | ICD-10-CM | POA: Diagnosis not present

## 2019-03-14 DIAGNOSIS — Z0001 Encounter for general adult medical examination with abnormal findings: Secondary | ICD-10-CM | POA: Diagnosis not present

## 2019-03-14 DIAGNOSIS — R7309 Other abnormal glucose: Secondary | ICD-10-CM | POA: Diagnosis not present

## 2019-03-14 DIAGNOSIS — M10031 Idiopathic gout, right wrist: Secondary | ICD-10-CM | POA: Diagnosis not present

## 2019-03-14 DIAGNOSIS — M1991 Primary osteoarthritis, unspecified site: Secondary | ICD-10-CM | POA: Diagnosis not present

## 2019-03-14 DIAGNOSIS — E039 Hypothyroidism, unspecified: Secondary | ICD-10-CM | POA: Diagnosis not present

## 2019-03-14 DIAGNOSIS — Z6830 Body mass index (BMI) 30.0-30.9, adult: Secondary | ICD-10-CM | POA: Diagnosis not present

## 2019-09-12 DIAGNOSIS — M9902 Segmental and somatic dysfunction of thoracic region: Secondary | ICD-10-CM | POA: Diagnosis not present

## 2019-09-12 DIAGNOSIS — S233XXA Sprain of ligaments of thoracic spine, initial encounter: Secondary | ICD-10-CM | POA: Diagnosis not present

## 2019-09-12 DIAGNOSIS — M9901 Segmental and somatic dysfunction of cervical region: Secondary | ICD-10-CM | POA: Diagnosis not present

## 2019-09-12 DIAGNOSIS — M9903 Segmental and somatic dysfunction of lumbar region: Secondary | ICD-10-CM | POA: Diagnosis not present

## 2019-09-12 DIAGNOSIS — S335XXA Sprain of ligaments of lumbar spine, initial encounter: Secondary | ICD-10-CM | POA: Diagnosis not present

## 2019-09-12 DIAGNOSIS — S134XXA Sprain of ligaments of cervical spine, initial encounter: Secondary | ICD-10-CM | POA: Diagnosis not present

## 2019-09-16 DIAGNOSIS — M9901 Segmental and somatic dysfunction of cervical region: Secondary | ICD-10-CM | POA: Diagnosis not present

## 2019-09-16 DIAGNOSIS — S233XXA Sprain of ligaments of thoracic spine, initial encounter: Secondary | ICD-10-CM | POA: Diagnosis not present

## 2019-09-16 DIAGNOSIS — S335XXA Sprain of ligaments of lumbar spine, initial encounter: Secondary | ICD-10-CM | POA: Diagnosis not present

## 2019-09-16 DIAGNOSIS — S134XXA Sprain of ligaments of cervical spine, initial encounter: Secondary | ICD-10-CM | POA: Diagnosis not present

## 2019-09-16 DIAGNOSIS — M9903 Segmental and somatic dysfunction of lumbar region: Secondary | ICD-10-CM | POA: Diagnosis not present

## 2019-09-16 DIAGNOSIS — M9902 Segmental and somatic dysfunction of thoracic region: Secondary | ICD-10-CM | POA: Diagnosis not present

## 2019-09-20 DIAGNOSIS — S335XXA Sprain of ligaments of lumbar spine, initial encounter: Secondary | ICD-10-CM | POA: Diagnosis not present

## 2019-09-20 DIAGNOSIS — M9902 Segmental and somatic dysfunction of thoracic region: Secondary | ICD-10-CM | POA: Diagnosis not present

## 2019-09-20 DIAGNOSIS — M9901 Segmental and somatic dysfunction of cervical region: Secondary | ICD-10-CM | POA: Diagnosis not present

## 2019-09-20 DIAGNOSIS — S233XXA Sprain of ligaments of thoracic spine, initial encounter: Secondary | ICD-10-CM | POA: Diagnosis not present

## 2019-09-20 DIAGNOSIS — M9903 Segmental and somatic dysfunction of lumbar region: Secondary | ICD-10-CM | POA: Diagnosis not present

## 2019-09-20 DIAGNOSIS — S134XXA Sprain of ligaments of cervical spine, initial encounter: Secondary | ICD-10-CM | POA: Diagnosis not present

## 2019-09-22 DIAGNOSIS — M9901 Segmental and somatic dysfunction of cervical region: Secondary | ICD-10-CM | POA: Diagnosis not present

## 2019-09-22 DIAGNOSIS — S335XXA Sprain of ligaments of lumbar spine, initial encounter: Secondary | ICD-10-CM | POA: Diagnosis not present

## 2019-09-22 DIAGNOSIS — S233XXA Sprain of ligaments of thoracic spine, initial encounter: Secondary | ICD-10-CM | POA: Diagnosis not present

## 2019-09-22 DIAGNOSIS — M9903 Segmental and somatic dysfunction of lumbar region: Secondary | ICD-10-CM | POA: Diagnosis not present

## 2019-09-22 DIAGNOSIS — S134XXA Sprain of ligaments of cervical spine, initial encounter: Secondary | ICD-10-CM | POA: Diagnosis not present

## 2019-09-22 DIAGNOSIS — M9902 Segmental and somatic dysfunction of thoracic region: Secondary | ICD-10-CM | POA: Diagnosis not present

## 2019-09-26 DIAGNOSIS — S335XXA Sprain of ligaments of lumbar spine, initial encounter: Secondary | ICD-10-CM | POA: Diagnosis not present

## 2019-09-26 DIAGNOSIS — M9901 Segmental and somatic dysfunction of cervical region: Secondary | ICD-10-CM | POA: Diagnosis not present

## 2019-09-26 DIAGNOSIS — S233XXA Sprain of ligaments of thoracic spine, initial encounter: Secondary | ICD-10-CM | POA: Diagnosis not present

## 2019-09-26 DIAGNOSIS — S134XXA Sprain of ligaments of cervical spine, initial encounter: Secondary | ICD-10-CM | POA: Diagnosis not present

## 2019-09-26 DIAGNOSIS — M9903 Segmental and somatic dysfunction of lumbar region: Secondary | ICD-10-CM | POA: Diagnosis not present

## 2019-09-26 DIAGNOSIS — M9902 Segmental and somatic dysfunction of thoracic region: Secondary | ICD-10-CM | POA: Diagnosis not present

## 2019-10-03 DIAGNOSIS — S233XXA Sprain of ligaments of thoracic spine, initial encounter: Secondary | ICD-10-CM | POA: Diagnosis not present

## 2019-10-03 DIAGNOSIS — S335XXA Sprain of ligaments of lumbar spine, initial encounter: Secondary | ICD-10-CM | POA: Diagnosis not present

## 2019-10-03 DIAGNOSIS — S134XXA Sprain of ligaments of cervical spine, initial encounter: Secondary | ICD-10-CM | POA: Diagnosis not present

## 2019-10-03 DIAGNOSIS — M9902 Segmental and somatic dysfunction of thoracic region: Secondary | ICD-10-CM | POA: Diagnosis not present

## 2019-10-03 DIAGNOSIS — M9903 Segmental and somatic dysfunction of lumbar region: Secondary | ICD-10-CM | POA: Diagnosis not present

## 2019-10-03 DIAGNOSIS — M9901 Segmental and somatic dysfunction of cervical region: Secondary | ICD-10-CM | POA: Diagnosis not present

## 2019-10-06 DIAGNOSIS — S134XXA Sprain of ligaments of cervical spine, initial encounter: Secondary | ICD-10-CM | POA: Diagnosis not present

## 2019-10-06 DIAGNOSIS — M9903 Segmental and somatic dysfunction of lumbar region: Secondary | ICD-10-CM | POA: Diagnosis not present

## 2019-10-06 DIAGNOSIS — M9901 Segmental and somatic dysfunction of cervical region: Secondary | ICD-10-CM | POA: Diagnosis not present

## 2019-10-06 DIAGNOSIS — S335XXA Sprain of ligaments of lumbar spine, initial encounter: Secondary | ICD-10-CM | POA: Diagnosis not present

## 2019-10-06 DIAGNOSIS — S233XXA Sprain of ligaments of thoracic spine, initial encounter: Secondary | ICD-10-CM | POA: Diagnosis not present

## 2019-10-06 DIAGNOSIS — M9902 Segmental and somatic dysfunction of thoracic region: Secondary | ICD-10-CM | POA: Diagnosis not present

## 2019-11-11 IMAGING — DX DG ELBOW COMPLETE 3+V*L*
4 series · 4 of 4 positions shown · non-contrast
Comparison: None.

CLINICAL DATA: Acute left elbow pain after injury.

EXAM:
LEFT ELBOW - COMPLETE 3+ VIEW

[elbow ap]
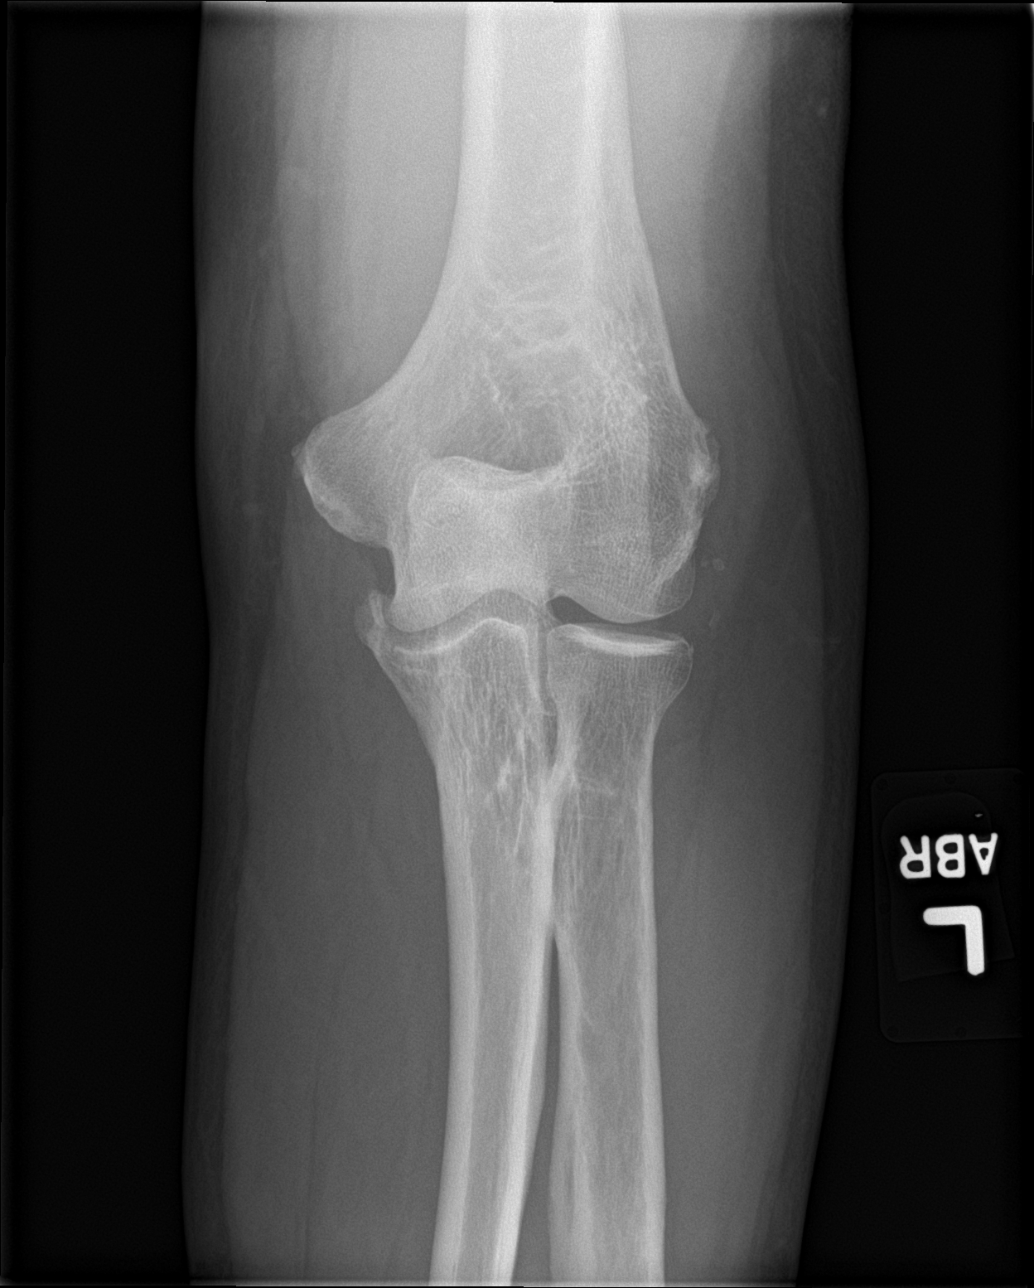

[elbow obl (1 of 2)]
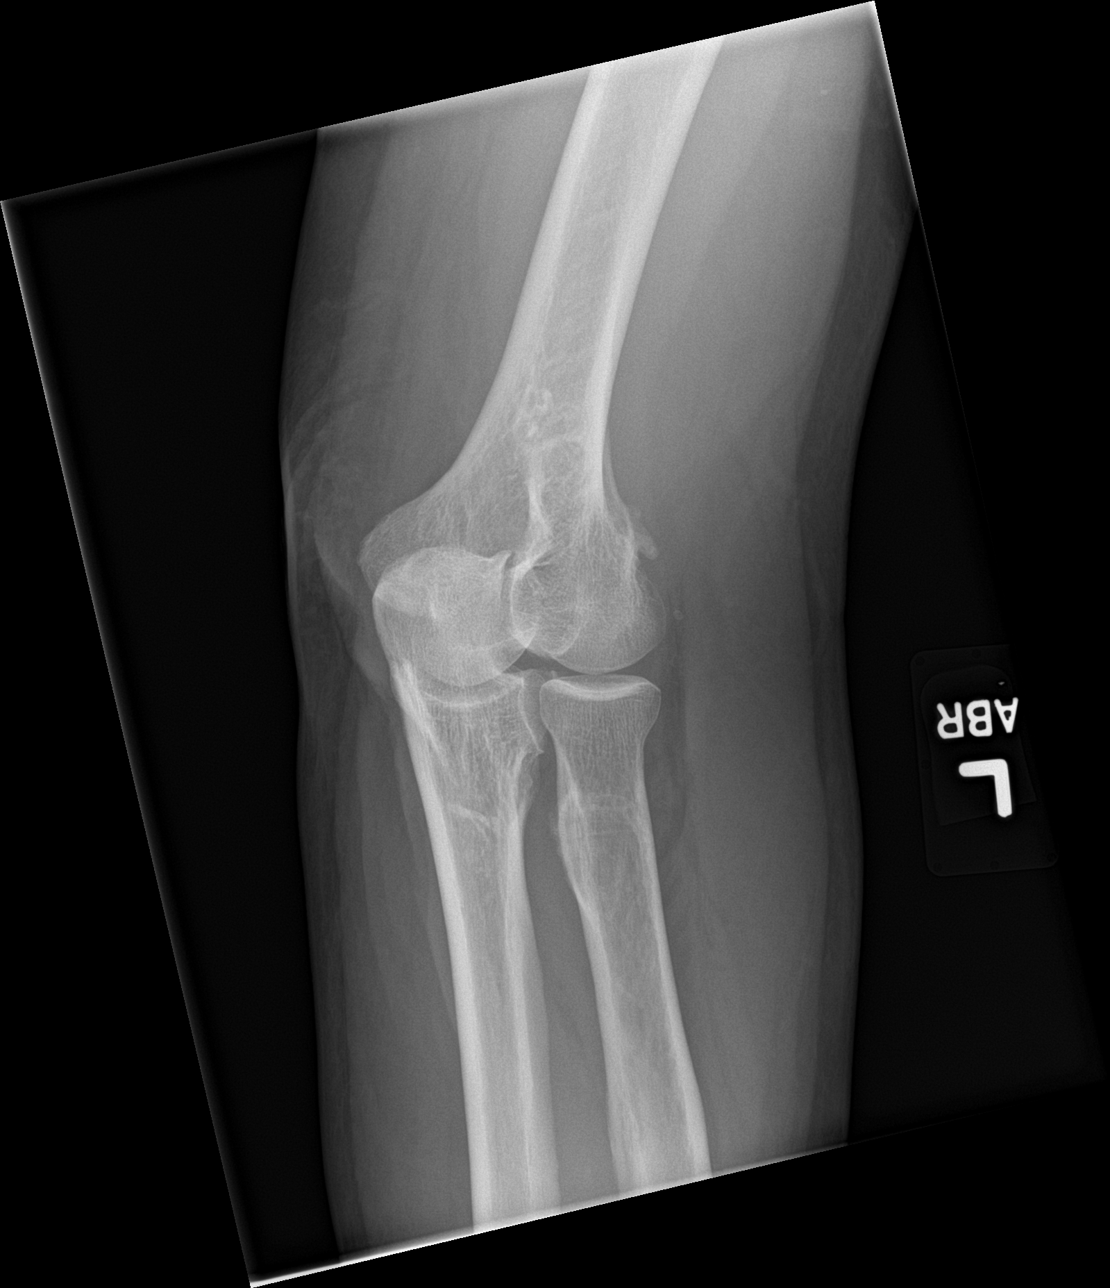

[elbow obl (2 of 2)]
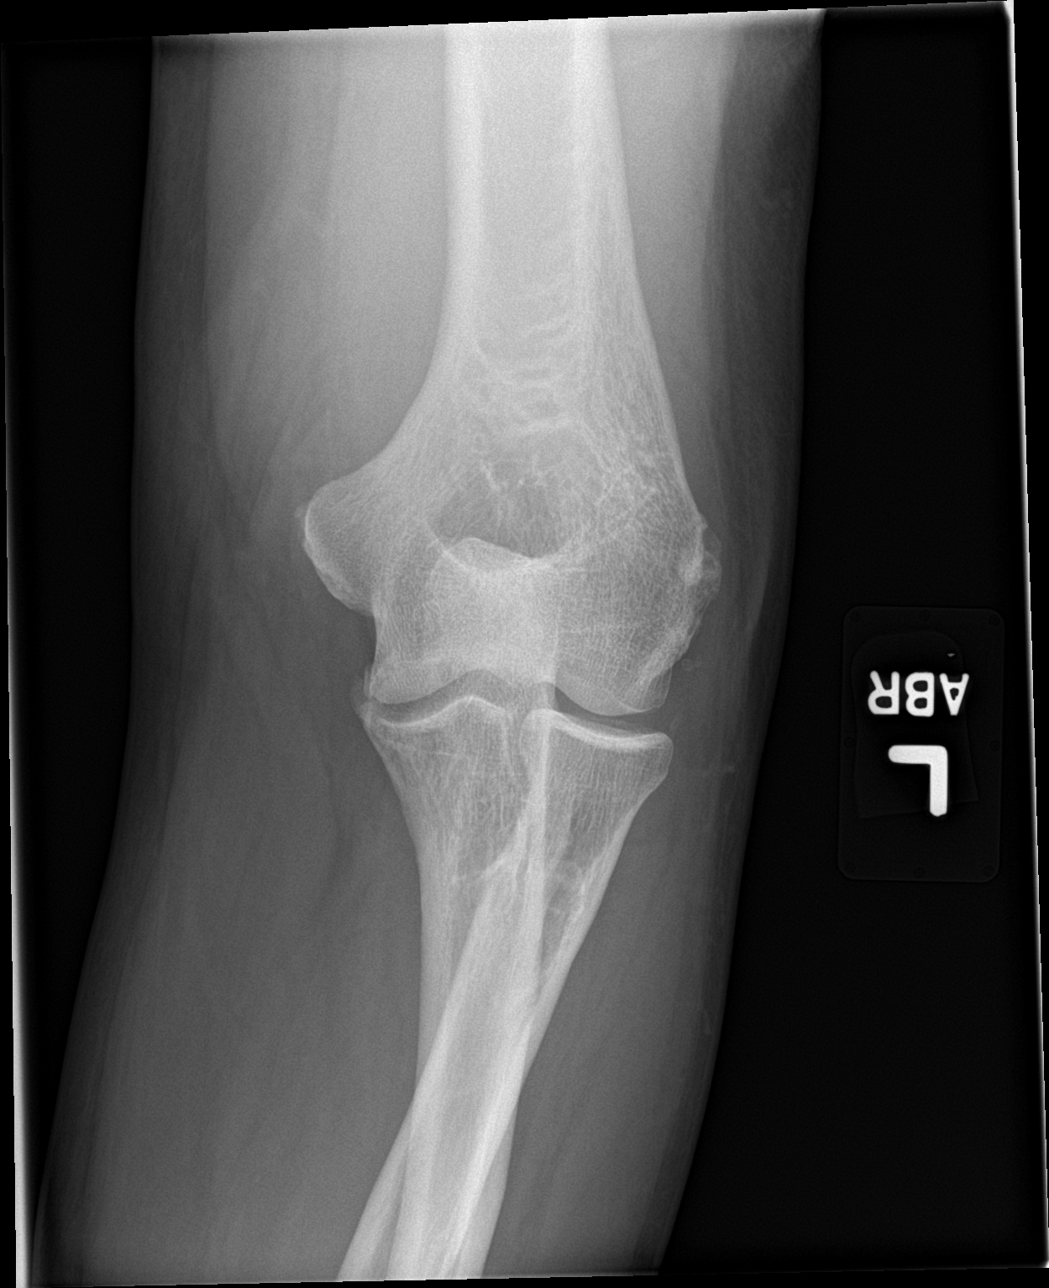

[elbow lat]
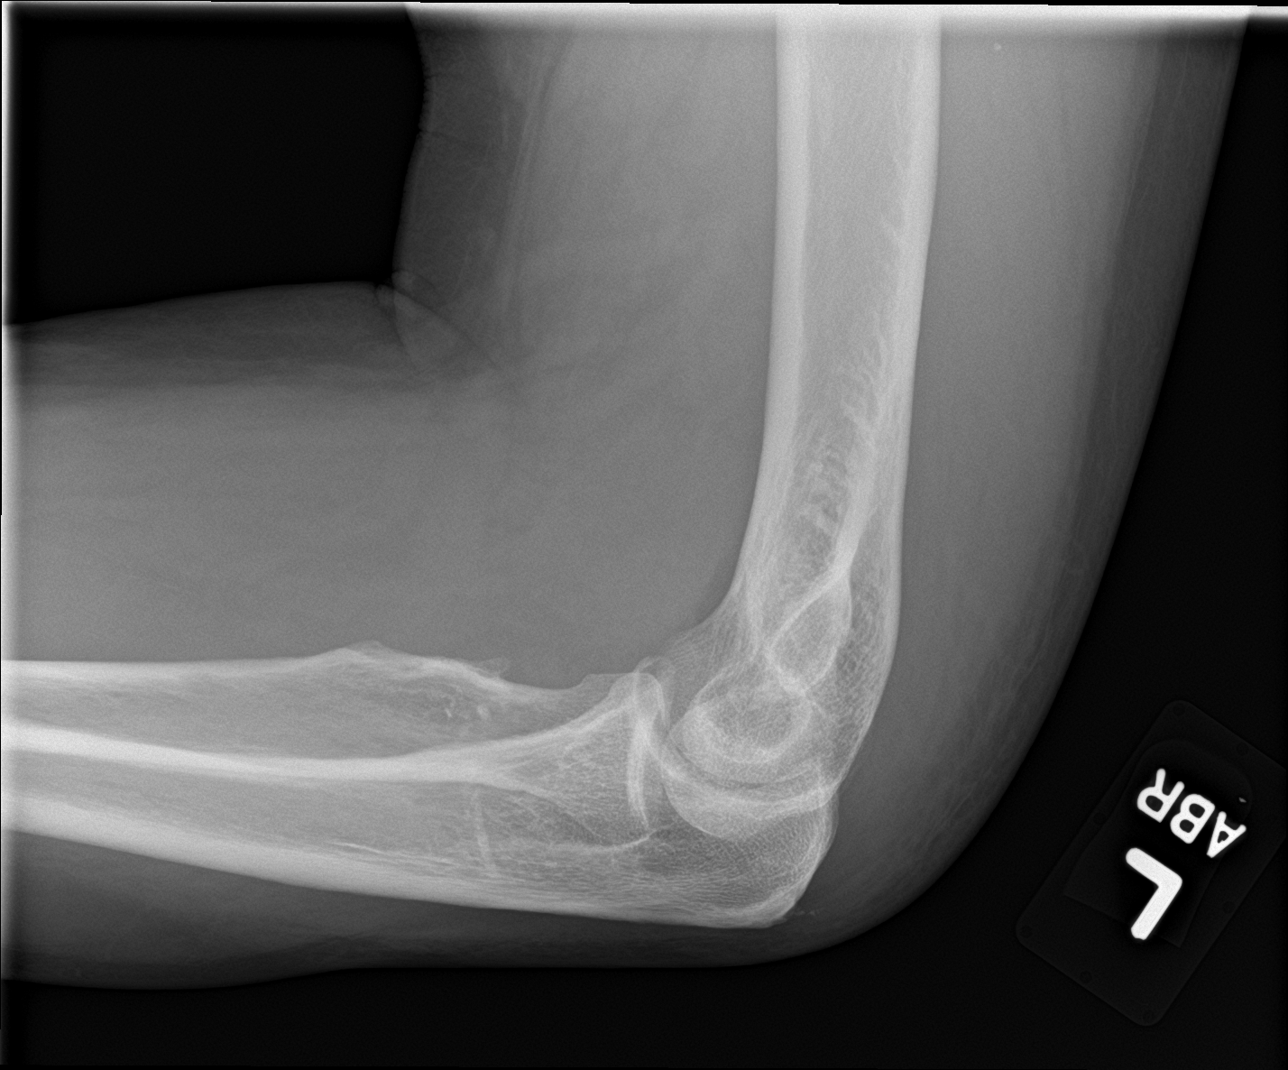

[4 of 4 positions shown; findings below may reference images not displayed]

FINDINGS: There is no evidence of fracture, dislocation, or joint effusion.
There is no evidence of arthropathy or other focal bone abnormality.
Soft tissues are unremarkable.
IMPRESSION: Negative.

## 2020-01-05 DIAGNOSIS — Z96641 Presence of right artificial hip joint: Secondary | ICD-10-CM | POA: Diagnosis not present

## 2020-01-05 DIAGNOSIS — M109 Gout, unspecified: Secondary | ICD-10-CM | POA: Diagnosis not present

## 2020-01-05 DIAGNOSIS — G4700 Insomnia, unspecified: Secondary | ICD-10-CM | POA: Diagnosis not present

## 2020-01-05 DIAGNOSIS — M71572 Other bursitis, not elsewhere classified, left ankle and foot: Secondary | ICD-10-CM | POA: Diagnosis not present

## 2020-01-05 DIAGNOSIS — E6609 Other obesity due to excess calories: Secondary | ICD-10-CM | POA: Diagnosis not present

## 2020-01-05 DIAGNOSIS — I10 Essential (primary) hypertension: Secondary | ICD-10-CM | POA: Diagnosis not present

## 2020-01-05 DIAGNOSIS — K219 Gastro-esophageal reflux disease without esophagitis: Secondary | ICD-10-CM | POA: Diagnosis not present

## 2020-01-05 DIAGNOSIS — R339 Retention of urine, unspecified: Secondary | ICD-10-CM | POA: Diagnosis not present

## 2020-01-05 DIAGNOSIS — Z9181 History of falling: Secondary | ICD-10-CM | POA: Diagnosis not present

## 2020-01-05 DIAGNOSIS — F339 Major depressive disorder, recurrent, unspecified: Secondary | ICD-10-CM | POA: Diagnosis not present

## 2020-01-05 DIAGNOSIS — M6281 Muscle weakness (generalized): Secondary | ICD-10-CM | POA: Diagnosis not present

## 2020-01-05 DIAGNOSIS — G5600 Carpal tunnel syndrome, unspecified upper limb: Secondary | ICD-10-CM | POA: Diagnosis not present

## 2020-01-05 DIAGNOSIS — N401 Enlarged prostate with lower urinary tract symptoms: Secondary | ICD-10-CM | POA: Diagnosis not present

## 2020-01-05 DIAGNOSIS — F4312 Post-traumatic stress disorder, chronic: Secondary | ICD-10-CM | POA: Diagnosis not present

## 2020-01-05 DIAGNOSIS — M25562 Pain in left knee: Secondary | ICD-10-CM | POA: Diagnosis not present

## 2020-01-05 DIAGNOSIS — M87051 Idiopathic aseptic necrosis of right femur: Secondary | ICD-10-CM | POA: Diagnosis not present

## 2020-01-05 DIAGNOSIS — F329 Major depressive disorder, single episode, unspecified: Secondary | ICD-10-CM | POA: Diagnosis not present

## 2020-01-05 DIAGNOSIS — M545 Low back pain: Secondary | ICD-10-CM | POA: Diagnosis not present

## 2020-01-05 DIAGNOSIS — R262 Difficulty in walking, not elsewhere classified: Secondary | ICD-10-CM | POA: Diagnosis not present

## 2020-01-05 DIAGNOSIS — R52 Pain, unspecified: Secondary | ICD-10-CM | POA: Diagnosis not present

## 2020-01-05 DIAGNOSIS — E785 Hyperlipidemia, unspecified: Secondary | ICD-10-CM | POA: Diagnosis not present

## 2020-01-05 DIAGNOSIS — M5400 Panniculitis affecting regions of neck and back, site unspecified: Secondary | ICD-10-CM | POA: Diagnosis not present

## 2020-01-05 DIAGNOSIS — Z471 Aftercare following joint replacement surgery: Secondary | ICD-10-CM | POA: Diagnosis not present

## 2020-01-05 DIAGNOSIS — M174 Other bilateral secondary osteoarthritis of knee: Secondary | ICD-10-CM | POA: Diagnosis not present

## 2020-01-10 DIAGNOSIS — R52 Pain, unspecified: Secondary | ICD-10-CM | POA: Diagnosis not present

## 2020-01-10 DIAGNOSIS — I10 Essential (primary) hypertension: Secondary | ICD-10-CM | POA: Diagnosis not present

## 2020-01-10 DIAGNOSIS — F329 Major depressive disorder, single episode, unspecified: Secondary | ICD-10-CM | POA: Diagnosis not present

## 2020-01-10 DIAGNOSIS — K219 Gastro-esophageal reflux disease without esophagitis: Secondary | ICD-10-CM | POA: Diagnosis not present

## 2020-01-10 DIAGNOSIS — G4700 Insomnia, unspecified: Secondary | ICD-10-CM | POA: Diagnosis not present

## 2020-01-10 DIAGNOSIS — M109 Gout, unspecified: Secondary | ICD-10-CM | POA: Diagnosis not present

## 2020-01-12 DIAGNOSIS — K219 Gastro-esophageal reflux disease without esophagitis: Secondary | ICD-10-CM | POA: Diagnosis not present

## 2020-01-12 DIAGNOSIS — N400 Enlarged prostate without lower urinary tract symptoms: Secondary | ICD-10-CM | POA: Diagnosis not present

## 2020-01-12 DIAGNOSIS — Z471 Aftercare following joint replacement surgery: Secondary | ICD-10-CM | POA: Diagnosis not present

## 2020-01-12 DIAGNOSIS — E785 Hyperlipidemia, unspecified: Secondary | ICD-10-CM | POA: Diagnosis not present

## 2020-01-12 DIAGNOSIS — Z7982 Long term (current) use of aspirin: Secondary | ICD-10-CM | POA: Diagnosis not present

## 2020-01-12 DIAGNOSIS — M109 Gout, unspecified: Secondary | ICD-10-CM | POA: Diagnosis not present

## 2020-01-12 DIAGNOSIS — Z96641 Presence of right artificial hip joint: Secondary | ICD-10-CM | POA: Diagnosis not present

## 2020-01-12 DIAGNOSIS — F329 Major depressive disorder, single episode, unspecified: Secondary | ICD-10-CM | POA: Diagnosis not present

## 2020-01-12 DIAGNOSIS — Z4789 Encounter for other orthopedic aftercare: Secondary | ICD-10-CM | POA: Diagnosis not present

## 2020-01-12 DIAGNOSIS — Z79891 Long term (current) use of opiate analgesic: Secondary | ICD-10-CM | POA: Diagnosis not present

## 2020-01-12 DIAGNOSIS — G4700 Insomnia, unspecified: Secondary | ICD-10-CM | POA: Diagnosis not present

## 2020-01-12 DIAGNOSIS — I10 Essential (primary) hypertension: Secondary | ICD-10-CM | POA: Diagnosis not present

## 2020-01-16 DIAGNOSIS — M109 Gout, unspecified: Secondary | ICD-10-CM | POA: Diagnosis not present

## 2020-01-16 DIAGNOSIS — Z96641 Presence of right artificial hip joint: Secondary | ICD-10-CM | POA: Diagnosis not present

## 2020-01-16 DIAGNOSIS — E785 Hyperlipidemia, unspecified: Secondary | ICD-10-CM | POA: Diagnosis not present

## 2020-01-16 DIAGNOSIS — I10 Essential (primary) hypertension: Secondary | ICD-10-CM | POA: Diagnosis not present

## 2020-01-16 DIAGNOSIS — K219 Gastro-esophageal reflux disease without esophagitis: Secondary | ICD-10-CM | POA: Diagnosis not present

## 2020-01-16 DIAGNOSIS — Z471 Aftercare following joint replacement surgery: Secondary | ICD-10-CM | POA: Diagnosis not present

## 2020-01-18 DIAGNOSIS — Z471 Aftercare following joint replacement surgery: Secondary | ICD-10-CM | POA: Diagnosis not present

## 2020-01-18 DIAGNOSIS — E785 Hyperlipidemia, unspecified: Secondary | ICD-10-CM | POA: Diagnosis not present

## 2020-01-18 DIAGNOSIS — M109 Gout, unspecified: Secondary | ICD-10-CM | POA: Diagnosis not present

## 2020-01-18 DIAGNOSIS — Z96641 Presence of right artificial hip joint: Secondary | ICD-10-CM | POA: Diagnosis not present

## 2020-01-18 DIAGNOSIS — K219 Gastro-esophageal reflux disease without esophagitis: Secondary | ICD-10-CM | POA: Diagnosis not present

## 2020-01-18 DIAGNOSIS — I10 Essential (primary) hypertension: Secondary | ICD-10-CM | POA: Diagnosis not present

## 2020-01-19 DIAGNOSIS — I10 Essential (primary) hypertension: Secondary | ICD-10-CM | POA: Diagnosis not present

## 2020-01-19 DIAGNOSIS — Z96641 Presence of right artificial hip joint: Secondary | ICD-10-CM | POA: Diagnosis not present

## 2020-01-19 DIAGNOSIS — Z471 Aftercare following joint replacement surgery: Secondary | ICD-10-CM | POA: Diagnosis not present

## 2020-01-19 DIAGNOSIS — M109 Gout, unspecified: Secondary | ICD-10-CM | POA: Diagnosis not present

## 2020-01-19 DIAGNOSIS — K219 Gastro-esophageal reflux disease without esophagitis: Secondary | ICD-10-CM | POA: Diagnosis not present

## 2020-01-19 DIAGNOSIS — E785 Hyperlipidemia, unspecified: Secondary | ICD-10-CM | POA: Diagnosis not present

## 2020-01-24 DIAGNOSIS — E785 Hyperlipidemia, unspecified: Secondary | ICD-10-CM | POA: Diagnosis not present

## 2020-01-24 DIAGNOSIS — Z471 Aftercare following joint replacement surgery: Secondary | ICD-10-CM | POA: Diagnosis not present

## 2020-01-24 DIAGNOSIS — Z96641 Presence of right artificial hip joint: Secondary | ICD-10-CM | POA: Diagnosis not present

## 2020-01-24 DIAGNOSIS — K219 Gastro-esophageal reflux disease without esophagitis: Secondary | ICD-10-CM | POA: Diagnosis not present

## 2020-01-24 DIAGNOSIS — I10 Essential (primary) hypertension: Secondary | ICD-10-CM | POA: Diagnosis not present

## 2020-01-24 DIAGNOSIS — M109 Gout, unspecified: Secondary | ICD-10-CM | POA: Diagnosis not present

## 2020-01-25 DIAGNOSIS — E785 Hyperlipidemia, unspecified: Secondary | ICD-10-CM | POA: Diagnosis not present

## 2020-01-25 DIAGNOSIS — M109 Gout, unspecified: Secondary | ICD-10-CM | POA: Diagnosis not present

## 2020-01-25 DIAGNOSIS — Z96641 Presence of right artificial hip joint: Secondary | ICD-10-CM | POA: Diagnosis not present

## 2020-01-25 DIAGNOSIS — K219 Gastro-esophageal reflux disease without esophagitis: Secondary | ICD-10-CM | POA: Diagnosis not present

## 2020-01-25 DIAGNOSIS — I10 Essential (primary) hypertension: Secondary | ICD-10-CM | POA: Diagnosis not present

## 2020-01-25 DIAGNOSIS — Z471 Aftercare following joint replacement surgery: Secondary | ICD-10-CM | POA: Diagnosis not present

## 2020-01-27 DIAGNOSIS — K219 Gastro-esophageal reflux disease without esophagitis: Secondary | ICD-10-CM | POA: Diagnosis not present

## 2020-01-27 DIAGNOSIS — Z96641 Presence of right artificial hip joint: Secondary | ICD-10-CM | POA: Diagnosis not present

## 2020-01-27 DIAGNOSIS — E785 Hyperlipidemia, unspecified: Secondary | ICD-10-CM | POA: Diagnosis not present

## 2020-01-27 DIAGNOSIS — I10 Essential (primary) hypertension: Secondary | ICD-10-CM | POA: Diagnosis not present

## 2020-01-27 DIAGNOSIS — Z471 Aftercare following joint replacement surgery: Secondary | ICD-10-CM | POA: Diagnosis not present

## 2020-01-27 DIAGNOSIS — M109 Gout, unspecified: Secondary | ICD-10-CM | POA: Diagnosis not present

## 2020-01-30 DIAGNOSIS — M109 Gout, unspecified: Secondary | ICD-10-CM | POA: Diagnosis not present

## 2020-01-30 DIAGNOSIS — Z6829 Body mass index (BMI) 29.0-29.9, adult: Secondary | ICD-10-CM | POA: Diagnosis not present

## 2020-01-30 DIAGNOSIS — Z1389 Encounter for screening for other disorder: Secondary | ICD-10-CM | POA: Diagnosis not present

## 2020-01-30 DIAGNOSIS — M1991 Primary osteoarthritis, unspecified site: Secondary | ICD-10-CM | POA: Diagnosis not present

## 2020-02-01 DIAGNOSIS — Z471 Aftercare following joint replacement surgery: Secondary | ICD-10-CM | POA: Diagnosis not present

## 2020-02-01 DIAGNOSIS — K219 Gastro-esophageal reflux disease without esophagitis: Secondary | ICD-10-CM | POA: Diagnosis not present

## 2020-02-01 DIAGNOSIS — M109 Gout, unspecified: Secondary | ICD-10-CM | POA: Diagnosis not present

## 2020-02-01 DIAGNOSIS — I10 Essential (primary) hypertension: Secondary | ICD-10-CM | POA: Diagnosis not present

## 2020-02-01 DIAGNOSIS — Z96641 Presence of right artificial hip joint: Secondary | ICD-10-CM | POA: Diagnosis not present

## 2020-02-01 DIAGNOSIS — E785 Hyperlipidemia, unspecified: Secondary | ICD-10-CM | POA: Diagnosis not present

## 2020-02-02 DIAGNOSIS — Z471 Aftercare following joint replacement surgery: Secondary | ICD-10-CM | POA: Diagnosis not present

## 2020-02-02 DIAGNOSIS — M109 Gout, unspecified: Secondary | ICD-10-CM | POA: Diagnosis not present

## 2020-02-02 DIAGNOSIS — K219 Gastro-esophageal reflux disease without esophagitis: Secondary | ICD-10-CM | POA: Diagnosis not present

## 2020-02-02 DIAGNOSIS — I10 Essential (primary) hypertension: Secondary | ICD-10-CM | POA: Diagnosis not present

## 2020-02-02 DIAGNOSIS — E785 Hyperlipidemia, unspecified: Secondary | ICD-10-CM | POA: Diagnosis not present

## 2020-02-02 DIAGNOSIS — Z96641 Presence of right artificial hip joint: Secondary | ICD-10-CM | POA: Diagnosis not present

## 2020-02-06 DIAGNOSIS — M109 Gout, unspecified: Secondary | ICD-10-CM | POA: Diagnosis not present

## 2020-02-06 DIAGNOSIS — Z96641 Presence of right artificial hip joint: Secondary | ICD-10-CM | POA: Diagnosis not present

## 2020-02-06 DIAGNOSIS — I10 Essential (primary) hypertension: Secondary | ICD-10-CM | POA: Diagnosis not present

## 2020-02-06 DIAGNOSIS — Z471 Aftercare following joint replacement surgery: Secondary | ICD-10-CM | POA: Diagnosis not present

## 2020-02-06 DIAGNOSIS — K219 Gastro-esophageal reflux disease without esophagitis: Secondary | ICD-10-CM | POA: Diagnosis not present

## 2020-02-06 DIAGNOSIS — E785 Hyperlipidemia, unspecified: Secondary | ICD-10-CM | POA: Diagnosis not present

## 2020-02-08 DIAGNOSIS — E785 Hyperlipidemia, unspecified: Secondary | ICD-10-CM | POA: Diagnosis not present

## 2020-02-08 DIAGNOSIS — Z96641 Presence of right artificial hip joint: Secondary | ICD-10-CM | POA: Diagnosis not present

## 2020-02-08 DIAGNOSIS — K219 Gastro-esophageal reflux disease without esophagitis: Secondary | ICD-10-CM | POA: Diagnosis not present

## 2020-02-08 DIAGNOSIS — I10 Essential (primary) hypertension: Secondary | ICD-10-CM | POA: Diagnosis not present

## 2020-02-08 DIAGNOSIS — Z471 Aftercare following joint replacement surgery: Secondary | ICD-10-CM | POA: Diagnosis not present

## 2020-02-08 DIAGNOSIS — M109 Gout, unspecified: Secondary | ICD-10-CM | POA: Diagnosis not present

## 2020-02-09 DIAGNOSIS — I10 Essential (primary) hypertension: Secondary | ICD-10-CM | POA: Diagnosis not present

## 2020-02-09 DIAGNOSIS — E785 Hyperlipidemia, unspecified: Secondary | ICD-10-CM | POA: Diagnosis not present

## 2020-02-09 DIAGNOSIS — M109 Gout, unspecified: Secondary | ICD-10-CM | POA: Diagnosis not present

## 2020-02-09 DIAGNOSIS — K219 Gastro-esophageal reflux disease without esophagitis: Secondary | ICD-10-CM | POA: Diagnosis not present

## 2020-02-09 DIAGNOSIS — Z96641 Presence of right artificial hip joint: Secondary | ICD-10-CM | POA: Diagnosis not present

## 2020-02-09 DIAGNOSIS — Z471 Aftercare following joint replacement surgery: Secondary | ICD-10-CM | POA: Diagnosis not present

## 2020-02-10 DIAGNOSIS — I10 Essential (primary) hypertension: Secondary | ICD-10-CM | POA: Diagnosis not present

## 2020-02-10 DIAGNOSIS — K219 Gastro-esophageal reflux disease without esophagitis: Secondary | ICD-10-CM | POA: Diagnosis not present

## 2020-02-10 DIAGNOSIS — Z471 Aftercare following joint replacement surgery: Secondary | ICD-10-CM | POA: Diagnosis not present

## 2020-02-10 DIAGNOSIS — Z96641 Presence of right artificial hip joint: Secondary | ICD-10-CM | POA: Diagnosis not present

## 2020-02-10 DIAGNOSIS — E785 Hyperlipidemia, unspecified: Secondary | ICD-10-CM | POA: Diagnosis not present

## 2020-02-10 DIAGNOSIS — M109 Gout, unspecified: Secondary | ICD-10-CM | POA: Diagnosis not present

## 2020-02-11 DIAGNOSIS — Z7982 Long term (current) use of aspirin: Secondary | ICD-10-CM | POA: Diagnosis not present

## 2020-02-11 DIAGNOSIS — N400 Enlarged prostate without lower urinary tract symptoms: Secondary | ICD-10-CM | POA: Diagnosis not present

## 2020-02-11 DIAGNOSIS — Z79891 Long term (current) use of opiate analgesic: Secondary | ICD-10-CM | POA: Diagnosis not present

## 2020-02-11 DIAGNOSIS — M109 Gout, unspecified: Secondary | ICD-10-CM | POA: Diagnosis not present

## 2020-02-11 DIAGNOSIS — E785 Hyperlipidemia, unspecified: Secondary | ICD-10-CM | POA: Diagnosis not present

## 2020-02-11 DIAGNOSIS — Z471 Aftercare following joint replacement surgery: Secondary | ICD-10-CM | POA: Diagnosis not present

## 2020-02-11 DIAGNOSIS — K219 Gastro-esophageal reflux disease without esophagitis: Secondary | ICD-10-CM | POA: Diagnosis not present

## 2020-02-11 DIAGNOSIS — Z96641 Presence of right artificial hip joint: Secondary | ICD-10-CM | POA: Diagnosis not present

## 2020-02-11 DIAGNOSIS — I10 Essential (primary) hypertension: Secondary | ICD-10-CM | POA: Diagnosis not present

## 2020-02-11 DIAGNOSIS — G4700 Insomnia, unspecified: Secondary | ICD-10-CM | POA: Diagnosis not present

## 2020-02-11 DIAGNOSIS — F329 Major depressive disorder, single episode, unspecified: Secondary | ICD-10-CM | POA: Diagnosis not present

## 2020-02-14 DIAGNOSIS — K219 Gastro-esophageal reflux disease without esophagitis: Secondary | ICD-10-CM | POA: Diagnosis not present

## 2020-02-14 DIAGNOSIS — E785 Hyperlipidemia, unspecified: Secondary | ICD-10-CM | POA: Diagnosis not present

## 2020-02-14 DIAGNOSIS — I10 Essential (primary) hypertension: Secondary | ICD-10-CM | POA: Diagnosis not present

## 2020-02-14 DIAGNOSIS — M109 Gout, unspecified: Secondary | ICD-10-CM | POA: Diagnosis not present

## 2020-02-14 DIAGNOSIS — Z471 Aftercare following joint replacement surgery: Secondary | ICD-10-CM | POA: Diagnosis not present

## 2020-02-14 DIAGNOSIS — Z96641 Presence of right artificial hip joint: Secondary | ICD-10-CM | POA: Diagnosis not present

## 2020-02-15 DIAGNOSIS — Z96641 Presence of right artificial hip joint: Secondary | ICD-10-CM | POA: Diagnosis not present

## 2020-02-15 DIAGNOSIS — Z471 Aftercare following joint replacement surgery: Secondary | ICD-10-CM | POA: Diagnosis not present

## 2020-02-15 DIAGNOSIS — K219 Gastro-esophageal reflux disease without esophagitis: Secondary | ICD-10-CM | POA: Diagnosis not present

## 2020-02-15 DIAGNOSIS — M109 Gout, unspecified: Secondary | ICD-10-CM | POA: Diagnosis not present

## 2020-02-15 DIAGNOSIS — E785 Hyperlipidemia, unspecified: Secondary | ICD-10-CM | POA: Diagnosis not present

## 2020-02-15 DIAGNOSIS — I10 Essential (primary) hypertension: Secondary | ICD-10-CM | POA: Diagnosis not present

## 2020-02-16 DIAGNOSIS — Z96641 Presence of right artificial hip joint: Secondary | ICD-10-CM | POA: Diagnosis not present

## 2020-02-16 DIAGNOSIS — M109 Gout, unspecified: Secondary | ICD-10-CM | POA: Diagnosis not present

## 2020-02-16 DIAGNOSIS — E785 Hyperlipidemia, unspecified: Secondary | ICD-10-CM | POA: Diagnosis not present

## 2020-02-16 DIAGNOSIS — I10 Essential (primary) hypertension: Secondary | ICD-10-CM | POA: Diagnosis not present

## 2020-02-16 DIAGNOSIS — Z471 Aftercare following joint replacement surgery: Secondary | ICD-10-CM | POA: Diagnosis not present

## 2020-02-16 DIAGNOSIS — K219 Gastro-esophageal reflux disease without esophagitis: Secondary | ICD-10-CM | POA: Diagnosis not present

## 2020-03-14 DIAGNOSIS — G894 Chronic pain syndrome: Secondary | ICD-10-CM | POA: Diagnosis not present

## 2020-03-14 DIAGNOSIS — Z0000 Encounter for general adult medical examination without abnormal findings: Secondary | ICD-10-CM | POA: Diagnosis not present

## 2020-03-14 DIAGNOSIS — G8929 Other chronic pain: Secondary | ICD-10-CM | POA: Diagnosis not present

## 2020-03-14 DIAGNOSIS — Z1331 Encounter for screening for depression: Secondary | ICD-10-CM | POA: Diagnosis not present

## 2020-03-14 DIAGNOSIS — M150 Primary generalized (osteo)arthritis: Secondary | ICD-10-CM | POA: Diagnosis not present

## 2020-03-14 DIAGNOSIS — Z6829 Body mass index (BMI) 29.0-29.9, adult: Secondary | ICD-10-CM | POA: Diagnosis not present

## 2020-03-14 DIAGNOSIS — E663 Overweight: Secondary | ICD-10-CM | POA: Diagnosis not present

## 2020-03-14 DIAGNOSIS — D649 Anemia, unspecified: Secondary | ICD-10-CM | POA: Diagnosis not present

## 2020-08-23 ENCOUNTER — Ambulatory Visit (INDEPENDENT_AMBULATORY_CARE_PROVIDER_SITE_OTHER): Payer: Medicare Other

## 2020-08-23 ENCOUNTER — Ambulatory Visit (INDEPENDENT_AMBULATORY_CARE_PROVIDER_SITE_OTHER): Payer: Medicare Other | Admitting: Podiatry

## 2020-08-23 ENCOUNTER — Encounter: Payer: Self-pay | Admitting: Podiatry

## 2020-08-23 ENCOUNTER — Other Ambulatory Visit: Payer: Self-pay

## 2020-08-23 DIAGNOSIS — M205X9 Other deformities of toe(s) (acquired), unspecified foot: Secondary | ICD-10-CM | POA: Diagnosis not present

## 2020-08-23 DIAGNOSIS — M21612 Bunion of left foot: Secondary | ICD-10-CM

## 2020-08-23 DIAGNOSIS — M779 Enthesopathy, unspecified: Secondary | ICD-10-CM | POA: Diagnosis not present

## 2020-08-23 DIAGNOSIS — M21611 Bunion of right foot: Secondary | ICD-10-CM

## 2020-08-23 DIAGNOSIS — M21619 Bunion of unspecified foot: Secondary | ICD-10-CM

## 2020-08-23 MED ORDER — TRIAMCINOLONE ACETONIDE 10 MG/ML IJ SUSP
20.0000 mg | Freq: Once | INTRAMUSCULAR | Status: AC
  Administered 2020-08-23: 20 mg

## 2020-08-23 NOTE — Progress Notes (Signed)
Subjective:   Patient ID: Jesse Yu, male   DOB: 75 y.o.   MRN: [id number]   HPI Patient presents stating he has developed a lot of pain around his big toe joints of both feet which is worsened over the last year.  States that he has tried shoe gear modifications and other modalities has had hip replacement that might put more pressure and has knee problems bilateral.  Patient does not smoke and is not able to be as active as he would like   Review of Systems  All other systems reviewed and are negative.       Objective:  Physical Exam Vitals and nursing note reviewed.  Constitutional:      Appearance: He is well-developed.  Pulmonary:     Effort: Pulmonary effort is normal.  Musculoskeletal:        General: Normal range of motion.  Skin:    General: Skin is warm.  Neurological:     Mental Status: He is alert.     Neurovascular status was found to be intact muscle strength was adequate range of motion adequate subtalar midtarsal joint.  Patient has significant loss first MPJ motion right over left with pain around the joint surface and splinting due to inflammation and dorsal spurring which is present.  Patient is found to have good digital perfusion well oriented x3     Assessment:  Structural hallux limitus condition bilateral with inflammatory changes around the first MPJ bilateral with pain upon pressure with fluid buildup around the joint surfaces     Plan:  H&P reviewed condition and will get a try conservative first at this point I did sterile prep and injected around the first MPJ bilateral 3 mg Dexasone Kenalog 5 mg Xylocaine advised on rigid bottom shoes to take pressure off the joint discussed the possibility for surgery discussed either joint replacement treatment possible osteotomy or fusion.  We will see results in a month and decide what else may be necessary  X-rays indicate there is large spurs around the first metatarsal and narrowing of the joint  surface with elevation of the first metatarsal segment right over left

## 2020-09-20 ENCOUNTER — Ambulatory Visit (INDEPENDENT_AMBULATORY_CARE_PROVIDER_SITE_OTHER): Payer: Medicare Other | Admitting: Podiatry

## 2020-09-20 ENCOUNTER — Other Ambulatory Visit: Payer: Self-pay

## 2020-09-20 ENCOUNTER — Encounter: Payer: Self-pay | Admitting: Podiatry

## 2020-09-20 DIAGNOSIS — M779 Enthesopathy, unspecified: Secondary | ICD-10-CM

## 2020-09-20 DIAGNOSIS — M205X9 Other deformities of toe(s) (acquired), unspecified foot: Secondary | ICD-10-CM

## 2020-09-23 NOTE — Progress Notes (Signed)
Subjective:   Patient ID: Jesse Yu, male   DOB: 75 y.o.   MRN: [id number]   HPI Patient states that he is not improved at the current time from the inflammation he was getting around his big toe joint   ROS      Objective:  Physical Exam  Neurovascular status intact with inflammation pain of the first MPJ bilateral that has improved with pain still present upon deep palpation     Assessment:  Hallux limitus deformity with inflammatory changes around the first MPJ bilateral     Plan:  H&P reviewed condition and discussed and educated him on surgical intervention which may be necessary in future.  At this time we are going to hold off I recommended he continue with supportive shoes anti-inflammatories and rigid bottom type shoes.  Reappoint as symptoms indicate and may require more aggressive treatment plan

## 2020-10-04 DIAGNOSIS — R7309 Other abnormal glucose: Secondary | ICD-10-CM | POA: Diagnosis not present

## 2020-10-04 DIAGNOSIS — Z13220 Encounter for screening for lipoid disorders: Secondary | ICD-10-CM | POA: Diagnosis not present

## 2020-10-24 DIAGNOSIS — G894 Chronic pain syndrome: Secondary | ICD-10-CM | POA: Diagnosis not present

## 2020-10-24 DIAGNOSIS — M1991 Primary osteoarthritis, unspecified site: Secondary | ICD-10-CM | POA: Diagnosis not present

## 2020-12-24 ENCOUNTER — Telehealth: Payer: Self-pay | Admitting: *Deleted

## 2020-12-24 NOTE — Telephone Encounter (Signed)
Patient is having severe pain in his heel, having to use a cane  Appointment has been scheduled Aug. 8th

## 2020-12-26 ENCOUNTER — Other Ambulatory Visit: Payer: Self-pay

## 2020-12-26 ENCOUNTER — Encounter: Payer: Self-pay | Admitting: Podiatry

## 2020-12-26 ENCOUNTER — Ambulatory Visit (INDEPENDENT_AMBULATORY_CARE_PROVIDER_SITE_OTHER): Payer: Medicare Other | Admitting: Podiatry

## 2020-12-26 DIAGNOSIS — M722 Plantar fascial fibromatosis: Secondary | ICD-10-CM | POA: Diagnosis not present

## 2020-12-26 DIAGNOSIS — M779 Enthesopathy, unspecified: Secondary | ICD-10-CM | POA: Diagnosis not present

## 2020-12-26 MED ORDER — TRIAMCINOLONE ACETONIDE 10 MG/ML IJ SUSP
20.0000 mg | Freq: Once | INTRAMUSCULAR | Status: AC
  Administered 2020-12-26: 20 mg

## 2020-12-27 NOTE — Progress Notes (Signed)
Subjective:   Patient ID: Jesse Yu, male   DOB: 75 y.o.   MRN: [id number]   HPI Patient presents stating the left heel has become very sore and she continues to have discomfort around the right big toe joint that is painful when pressed and makes walking difficult.  Patient thinks it is probably gout   ROS      Objective:  Physical Exam  Neurovascular status intact with acute discomfort plantar aspect left heel at the insertion and inflammation pain with moderate swelling of the first MPJ right with limited range of motion and spur formation     Assessment:  Acute Planter fasciitis left with hallux limitus with inflammatory capsulitis first MPJ right with possibility for gout     Plan:  H&P reviewed both conditions and went ahead did sterile prep left injected the plantar fashion 3 mg Kenalog 5 mg Xylocaine and for the right I went ahead and did sterile prep injected around the first MPJ 3 mg Dexasone Kenalog 5 mg Xylocaine and discussed possible surgical correction of deformity right foot in future

## 2021-01-09 ENCOUNTER — Ambulatory Visit (INDEPENDENT_AMBULATORY_CARE_PROVIDER_SITE_OTHER): Payer: Medicare Other | Admitting: Podiatry

## 2021-01-09 ENCOUNTER — Other Ambulatory Visit: Payer: Self-pay

## 2021-01-09 ENCOUNTER — Encounter: Payer: Self-pay | Admitting: Podiatry

## 2021-01-09 DIAGNOSIS — M722 Plantar fascial fibromatosis: Secondary | ICD-10-CM | POA: Diagnosis not present

## 2021-01-09 MED ORDER — TRIAMCINOLONE ACETONIDE 10 MG/ML IJ SUSP
10.0000 mg | Freq: Once | INTRAMUSCULAR | Status: AC
  Administered 2021-01-09: 10 mg

## 2021-01-09 NOTE — Progress Notes (Signed)
Subjective:   Patient ID: Jesse Yu, male   DOB: 75 y.o.   MRN: [id number]   HPI Patient presents stating my heel has been sore after I traumatized it.  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

## 2021-05-02 ENCOUNTER — Ambulatory Visit (INDEPENDENT_AMBULATORY_CARE_PROVIDER_SITE_OTHER): Payer: Medicare Other | Admitting: Podiatry

## 2021-05-02 ENCOUNTER — Other Ambulatory Visit: Payer: Self-pay

## 2021-05-02 ENCOUNTER — Ambulatory Visit (INDEPENDENT_AMBULATORY_CARE_PROVIDER_SITE_OTHER): Payer: Medicare Other

## 2021-05-02 ENCOUNTER — Encounter: Payer: Self-pay | Admitting: Podiatry

## 2021-05-02 DIAGNOSIS — M205X9 Other deformities of toe(s) (acquired), unspecified foot: Secondary | ICD-10-CM | POA: Diagnosis not present

## 2021-05-02 DIAGNOSIS — M205X1 Other deformities of toe(s) (acquired), right foot: Secondary | ICD-10-CM | POA: Diagnosis not present

## 2021-05-02 NOTE — Progress Notes (Signed)
Subjective:   Patient ID: Jesse Yu, male   DOB: 75 y.o.   MRN: [id number]   HPI Patient presents stating that his big toe joint is bothering me more and I would like to get it corrected after the first of the year.  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  The left is doing well with the fascial symptoms under control     Assessment:  Hallux limitus rigidus condition right with spur formation narrowing of the joint surface and pain of the joint along with improved fasciitis left     Plan:  NP reviewed the right and x-rays and discussed the possibility for spur removal versus the possibility for joint implantation procedure or osteotomy procedure.  I did explain will have to evaluate the cartilage before making a decision and it is possible that we may do a osteotomy to improve motion save the joint but that long-term it still could require an implant depending on how it looks at the surgical event.  He understands this completely and I allowed him to read consent form and after reviewing consent form alternative treatments complications he signs and we reviewed the surgery and the postoperative recovery.  Patient understands will take 6 months to 1 year for complete recovery and wants surgery and at this point I dispensed air fracture walker with instructions on usage  X-rays indicate that there is spur formation dorsal first metatarsal and slightly on the hallux with narrowing of the joint surface and elevation of the first metatarsal segment right

## 2021-05-17 ENCOUNTER — Telehealth: Payer: Self-pay | Admitting: Urology

## 2021-05-17 NOTE — Telephone Encounter (Signed)
DOS - 06/11/21  [place] BUNIONECTOMY RIGHT --- [id number]  [employer] EFFECTIVE DATE 05/19/16   SPOKE WITH MARGO M. AND SHE STATED THAT FOR CPT CODE 68257 NO PRIOR AUTH IS REQUIRED.  REF # [id number]

## 2021-06-10 MED ORDER — HYDROCODONE-ACETAMINOPHEN 10-325 MG PO TABS: 1.0000 | ORAL_TABLET | Freq: Three times a day (TID) | ORAL | 0 refills | Status: AC | PRN

## 2021-06-10 NOTE — Addendum Note (Signed)
Addended by: Lenn Sink on: 06/10/2021 01:28 PM   Modules accepted: Orders

## 2021-06-11 ENCOUNTER — Encounter: Payer: Self-pay | Admitting: Podiatry

## 2021-06-11 DIAGNOSIS — M2021 Hallux rigidus, right foot: Secondary | ICD-10-CM | POA: Diagnosis not present

## 2021-06-11 DIAGNOSIS — M2011 Hallux valgus (acquired), right foot: Secondary | ICD-10-CM | POA: Diagnosis not present

## 2021-06-17 ENCOUNTER — Ambulatory Visit (INDEPENDENT_AMBULATORY_CARE_PROVIDER_SITE_OTHER): Payer: Medicare Other

## 2021-06-17 ENCOUNTER — Other Ambulatory Visit: Payer: Self-pay

## 2021-06-17 ENCOUNTER — Encounter: Payer: Self-pay | Admitting: Podiatry

## 2021-06-17 ENCOUNTER — Ambulatory Visit (INDEPENDENT_AMBULATORY_CARE_PROVIDER_SITE_OTHER): Payer: Medicare Other | Admitting: Podiatry

## 2021-06-17 DIAGNOSIS — Z9889 Other specified postprocedural states: Secondary | ICD-10-CM

## 2021-06-18 NOTE — Progress Notes (Signed)
Subjective:   Patient ID: Jesse Yu, male   DOB: 76 y.o.   MRN: [id number]   HPI Patient presents stating doing very well pleased so far with results with minimal discomfort   ROS      Objective:  Physical Exam  Alert status intact incision intact no drainage noted wound edges well coapted range of motion adequate no crepitus of the joint no pain with movement     Assessment:  Doing well post implant procedure first MPJ right     Plan:  H&P reviewed condition continue with immobilization elevation compression surgical shoe dispensed and patient will be seen back 3 weeks or earlier if needed  X-rays indicate implant well-seated good position

## 2021-07-08 ENCOUNTER — Ambulatory Visit (INDEPENDENT_AMBULATORY_CARE_PROVIDER_SITE_OTHER): Payer: Medicare Other

## 2021-07-08 ENCOUNTER — Other Ambulatory Visit: Payer: Self-pay

## 2021-07-08 ENCOUNTER — Encounter: Payer: Self-pay | Admitting: Podiatry

## 2021-07-08 ENCOUNTER — Ambulatory Visit (INDEPENDENT_AMBULATORY_CARE_PROVIDER_SITE_OTHER): Payer: Medicare Other | Admitting: Podiatry

## 2021-07-08 ENCOUNTER — Other Ambulatory Visit: Payer: Self-pay | Admitting: Podiatry

## 2021-07-08 DIAGNOSIS — M722 Plantar fascial fibromatosis: Secondary | ICD-10-CM

## 2021-07-08 DIAGNOSIS — M205X2 Other deformities of toe(s) (acquired), left foot: Secondary | ICD-10-CM

## 2021-07-08 DIAGNOSIS — M205X1 Other deformities of toe(s) (acquired), right foot: Secondary | ICD-10-CM | POA: Diagnosis not present

## 2021-07-08 DIAGNOSIS — Z9889 Other specified postprocedural states: Secondary | ICD-10-CM

## 2021-07-09 ENCOUNTER — Telehealth: Payer: Self-pay | Admitting: Urology

## 2021-07-09 NOTE — Telephone Encounter (Signed)
DOS - 07/16/21  [place] BUNIONECTOMY LEFT --- [id number]  [employer] EFFECTIVE DATE - 05/19/16  PLAN DEDUCTIBLE  - $350.00 W/ $350.00 REMAINING OUT OF POCKET - $6,000.00 W/ $5,957.50Remaining COINSURANCE - 0% COPAY - $0.00  SPOKE WITH NICOLE WITH [employer] AND SHE STATED THAT FOR CPT CODE 95638 NO PRIOR AUTH IS REQUIRED SINCE MEDICARE IS PRIMARY INSURANCE.  REF # [id number]

## 2021-07-09 NOTE — Progress Notes (Signed)
Subjective:   Patient ID: Jesse Yu, male   DOB: 76 y.o.   MRN: [id number]   HPI Patient states very happy with surgery and wants to get his other foot done in the next several weeks   ROS      Objective:  Physical Exam  Neurovascular status intact negative Denna Haggard' sign noted wound edges coapted well excellent range of motion of the big toe joint right with reduced range of motion left with pain in the joint surface     Assessment:  Doing well post implant procedure right with hallux limitus deformity left not as severe as the right     Plan:  X-rays taken of both feet and I do not see currently that the arthritis on the left is is severe and I have recommended hopefully a biplanar osteotomy with the possibility for implant depending on what it looks like.  For the right continue range of motion exercises gradual return to soft shoe gear and surgery for the left is reviewed patient review consent form understands alternative treatments complications and risk and wants surgery and that just because 1 foot did so well no guarantee of the foot will do as well.  Patient scheduled outpatient surgery encouraged to call questions  X-rays indicate implant in excellent position right and spurring with shortening and narrowing of the joint surface first MPJ left

## 2021-07-15 MED ORDER — HYDROCODONE-ACETAMINOPHEN 10-325 MG PO TABS: 1.0000 | ORAL_TABLET | Freq: Three times a day (TID) | ORAL | 0 refills | Status: AC | PRN

## 2021-07-15 NOTE — Addendum Note (Signed)
Addended by: Wallene Huh on: 07/15/2021 03:55 PM   Modules accepted: Orders

## 2021-07-16 DIAGNOSIS — M2012 Hallux valgus (acquired), left foot: Secondary | ICD-10-CM | POA: Diagnosis not present

## 2021-07-16 DIAGNOSIS — M2022 Hallux rigidus, left foot: Secondary | ICD-10-CM | POA: Diagnosis not present

## 2021-07-17 ENCOUNTER — Other Ambulatory Visit (INDEPENDENT_AMBULATORY_CARE_PROVIDER_SITE_OTHER): Payer: 59 | Admitting: Podiatry

## 2021-07-17 ENCOUNTER — Encounter: Payer: Self-pay | Admitting: Podiatry

## 2021-07-17 DIAGNOSIS — Z91199 Patient's noncompliance with other medical treatment and regimen due to unspecified reason: Secondary | ICD-10-CM

## 2021-07-19 ENCOUNTER — Telehealth: Payer: Self-pay | Admitting: *Deleted

## 2021-07-19 NOTE — Telephone Encounter (Signed)
Patient is calling because he is still having a lot of pain, foot is more hurtful than before the operation,concerned. ? He is taking the naproxen, 1/2 tablet of hydrocodone, not helping. Please advise. ?

## 2021-07-19 NOTE — Telephone Encounter (Signed)
Today should be the peak. Make sure to keep it elevated today and should be better next couple of days

## 2021-07-20 ENCOUNTER — Other Ambulatory Visit: Payer: Self-pay | Admitting: Podiatry

## 2021-07-20 MED ORDER — GABAPENTIN 100 MG PO CAPS: 100.0000 mg | ORAL_CAPSULE | Freq: Three times a day (TID) | ORAL | 3 refills | Status: DC

## 2021-07-24 ENCOUNTER — Other Ambulatory Visit: Payer: Self-pay

## 2021-07-24 ENCOUNTER — Ambulatory Visit (INDEPENDENT_AMBULATORY_CARE_PROVIDER_SITE_OTHER): Payer: Medicare Other

## 2021-07-24 ENCOUNTER — Encounter: Payer: Self-pay | Admitting: Podiatry

## 2021-07-24 ENCOUNTER — Ambulatory Visit (INDEPENDENT_AMBULATORY_CARE_PROVIDER_SITE_OTHER): Payer: Medicare Other | Admitting: Podiatry

## 2021-07-24 DIAGNOSIS — Z9889 Other specified postprocedural states: Secondary | ICD-10-CM

## 2021-07-25 NOTE — Progress Notes (Signed)
Subjective:  ? ?Patient ID: Jesse Yu, male   DOB: 76 y.o.   MRN: [id number]  ? ?HPI ?Patient states I am doing well but I did have more discomfort on this 1 that I did on the other foot ? ? ?ROS ? ? ?   ?Objective:  ?Physical Exam  ?Neurovascular status intact negative Denna Haggard' sign noted wound edges left are healing very well range of motion of the joint is good and patient did have an osteotomy versus implant of the right foot ? ?   ?Assessment:  ?Doing well post osteotomy first metatarsal left with adequate range of motion and good healing ? ?   ?Plan:  ?H&P x-ray reviewed continue immobilization elevation compression and reappoint 4 weeks or earlier if needed ? ?X-rays indicate that fixation is placed joint is congruent [place] and looks healthy at this time ?   ? ? ?

## 2021-08-16 DIAGNOSIS — M1991 Primary osteoarthritis, unspecified site: Secondary | ICD-10-CM | POA: Diagnosis not present

## 2021-08-16 DIAGNOSIS — G4700 Insomnia, unspecified: Secondary | ICD-10-CM | POA: Diagnosis not present

## 2021-08-16 DIAGNOSIS — E039 Hypothyroidism, unspecified: Secondary | ICD-10-CM | POA: Diagnosis not present

## 2021-08-16 DIAGNOSIS — Z1331 Encounter for screening for depression: Secondary | ICD-10-CM | POA: Diagnosis not present

## 2021-08-16 DIAGNOSIS — E6609 Other obesity due to excess calories: Secondary | ICD-10-CM | POA: Diagnosis not present

## 2021-08-16 DIAGNOSIS — E782 Mixed hyperlipidemia: Secondary | ICD-10-CM | POA: Diagnosis not present

## 2021-08-16 DIAGNOSIS — R7309 Other abnormal glucose: Secondary | ICD-10-CM | POA: Diagnosis not present

## 2021-08-16 DIAGNOSIS — Z125 Encounter for screening for malignant neoplasm of prostate: Secondary | ICD-10-CM | POA: Diagnosis not present

## 2021-08-16 DIAGNOSIS — Z6833 Body mass index (BMI) 33.0-33.9, adult: Secondary | ICD-10-CM | POA: Diagnosis not present

## 2021-08-16 DIAGNOSIS — G894 Chronic pain syndrome: Secondary | ICD-10-CM | POA: Diagnosis not present

## 2021-08-16 DIAGNOSIS — Z0001 Encounter for general adult medical examination with abnormal findings: Secondary | ICD-10-CM | POA: Diagnosis not present

## 2021-08-21 ENCOUNTER — Ambulatory Visit (INDEPENDENT_AMBULATORY_CARE_PROVIDER_SITE_OTHER): Payer: Medicare Other

## 2021-08-21 ENCOUNTER — Ambulatory Visit (INDEPENDENT_AMBULATORY_CARE_PROVIDER_SITE_OTHER): Payer: Medicare Other | Admitting: Podiatry

## 2021-08-21 ENCOUNTER — Encounter: Payer: Medicare Other | Admitting: Podiatry

## 2021-08-21 ENCOUNTER — Encounter: Payer: Self-pay | Admitting: Podiatry

## 2021-08-21 DIAGNOSIS — T81509A Unspecified complication of foreign body accidentally left in body following unspecified procedure, initial encounter: Secondary | ICD-10-CM

## 2021-08-21 NOTE — Progress Notes (Signed)
Subjective:  ? ?Patient ID: Jesse Yu, male   DOB: 76 y.o.   MRN: [id number]  ? ?HPI ?Patient presents stating I am doing better but still gets some discomfort if I do too much overall though I am continuing to show progress neuro ? ? ?ROS ? ? ?   ?Objective:  ?Physical Exam  ?Vascular status intact patient's big toe joints bilateral doing well range of motion is improving with no crepitus of either joint  ? ?   ?Assessment:  ?Doing well post implant right osteotomy left with good range of motion ? ?   ?Plan:  ?Reviewed condition explained is normal to still have some swelling that it will come around completely but it is good to take another several months to do that completely but patient can gradually return to tennis shoes and activity.  Patient is encouraged to call questions concerns and at this point will be seen back in approximate 6 weeks or as needed ? ?X-rays indicate osteotomies healing well fixation in place with pins intact.  No indication of movement ?   ? ? ?

## 2021-12-07 ENCOUNTER — Other Ambulatory Visit: Payer: Self-pay | Admitting: Podiatry

## 2021-12-11 ENCOUNTER — Ambulatory Visit: Payer: Self-pay | Admitting: *Deleted

## 2021-12-11 ENCOUNTER — Encounter: Payer: Self-pay | Admitting: *Deleted

## 2021-12-11 NOTE — Patient Outreach (Signed)
  Care Coordination   Initial Visit Note   12/11/2021 Name: Jesse Yu MRN: [id number] DOB: [birth year]  Jesse Yu is a 76 y.o. year old male who sees Elfredia Nevins, MD for primary care. I spoke with  Jesse Yu by phone today.  What matters to the patients health and wellness today?  No Interventions Identified.   SDOH assessments and interventions completed:   Yes SDOH Interventions Today    Flowsheet Row Most Recent Value  SDOH Interventions   Food Insecurity Interventions Intervention Not Indicated  Financial Strain Interventions Intervention Not Indicated  Housing Interventions Intervention Not Indicated  Physical Activity Interventions Patient Refused  Stress Interventions Intervention Not Indicated  Social Connections Interventions Intervention Not Indicated  Transportation Interventions Intervention Not Indicated       Care Coordination Interventions Activated:  No  Care Coordination Interventions:  No, not indicated.  Follow up plan: No further intervention required.  Encounter Outcome:  Pt. Visit Completed.  Danford Bad, BSW, MSW, LCSW  Licensed Restaurant manager, [employer] [facility]  Mailing [place] [address], [place], [place] [address] Physical Address-[address] [place], [place] [address] Toll Free Main # [phone] Fax # [phone] Cell # [phone] [email or web address]

## 2021-12-11 NOTE — Patient Instructions (Signed)
Visit Information  Thank you for taking time to visit with me today. Please don't hesitate to contact me if I can be of assistance to you before our next scheduled telephone appointment.  Please call the care guide team at [phone] if you need to cancel or reschedule your appointment.   If you are experiencing a Mental Health or Behavioral Health Crisis or need someone to talk to, please call the Suicide and Crisis Lifeline: 988 call the [place] National Suicide Prevention Lifeline: 442-754-9621 or TTY: 5400390880 TTY 636-650-3256) to talk to a trained counselor call 1-800-273-TALK (toll free, 24 hour hotline) go to [facility] [address] [place] [phone]) call the [facility] Crisis Line: 818-212-7108 call 911   Mr. Swicegood was given information about Care Management services by the embedded care coordination team including:  Care Management services include personalized support from designated clinical staff supervised by his physician, including individualized plan of care and coordination with other care providers 24/7 contact phone numbers for assistance for urgent and routine care needs. The patient may stop CCM services at any time (effective at the end of the month) by phone call to the office staff.  Patient agreed to services and verbal consent obtained.   Patient verbalizes understanding of instructions and care plan provided today and agrees to view in MyChart. Active MyChart status and patient understanding of how to access instructions and care plan via MyChart confirmed with patient.     No follow up required.  Danford Bad, BSW, MSW, LCSW  Licensed Restaurant manager, [employer] [facility]  Mailing [place] [address], [place], [place] [address] Physical Address-[address], [place], [place] [address] Toll Free Main # [phone] Fax # [phone] Cell #  [phone] [email or web address]

## 2022-01-27 ENCOUNTER — Ambulatory Visit
Admission: EM | Admit: 2022-01-27 | Discharge: 2022-01-27 | Disposition: A | Discharge status: Home / Self Care | Payer: Medicare Other

## 2022-01-27 ENCOUNTER — Ambulatory Visit: Payer: Self-pay

## 2022-01-27 DIAGNOSIS — J309 Allergic rhinitis, unspecified: Secondary | ICD-10-CM

## 2022-01-27 DIAGNOSIS — R0982 Postnasal drip: Secondary | ICD-10-CM | POA: Diagnosis not present

## 2022-01-27 MED ORDER — LIDOCAINE VISCOUS HCL 2 % MT SOLN: 15.0000 mL | OROMUCOSAL | 0 refills | Status: AC | PRN

## 2022-01-27 MED ORDER — FLUTICASONE PROPIONATE 50 MCG/ACT NA SUSP: 1.0000 | Freq: Every day | NASAL | 0 refills | Status: AC

## 2022-01-27 MED ORDER — CETIRIZINE HCL 10 MG PO TABS: 10.0000 mg | ORAL_TABLET | Freq: Every day | ORAL | 0 refills | Status: AC

## 2022-01-27 NOTE — Discharge Instructions (Addendum)
-   please start on the allergy regimen (oral antihistamine and nasal spray) to help with allergy symptoms - You can use viscious lidocaine to help numb the back of your throat

## 2022-01-27 NOTE — ED Triage Notes (Signed)
Pt report nasal congestion, sneezing , drainage and pain in soft palate x 2 weeks. Pt think he has a sinus infection. Benadryl gives no relief.
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
## 2022-05-27 DIAGNOSIS — E6609 Other obesity due to excess calories: Secondary | ICD-10-CM | POA: Diagnosis not present

## 2022-05-27 DIAGNOSIS — K760 Fatty (change of) liver, not elsewhere classified: Secondary | ICD-10-CM | POA: Diagnosis not present

## 2022-05-27 DIAGNOSIS — G894 Chronic pain syndrome: Secondary | ICD-10-CM | POA: Diagnosis not present

## 2022-05-27 DIAGNOSIS — Z6833 Body mass index (BMI) 33.0-33.9, adult: Secondary | ICD-10-CM | POA: Diagnosis not present

## 2022-05-27 DIAGNOSIS — M1991 Primary osteoarthritis, unspecified site: Secondary | ICD-10-CM | POA: Diagnosis not present

## 2022-05-27 DIAGNOSIS — N281 Cyst of kidney, acquired: Secondary | ICD-10-CM | POA: Diagnosis not present

## 2022-08-21 DIAGNOSIS — E6609 Other obesity due to excess calories: Secondary | ICD-10-CM | POA: Diagnosis not present

## 2022-08-21 DIAGNOSIS — E559 Vitamin D deficiency, unspecified: Secondary | ICD-10-CM | POA: Diagnosis not present

## 2022-08-21 DIAGNOSIS — Z1331 Encounter for screening for depression: Secondary | ICD-10-CM | POA: Diagnosis not present

## 2022-08-21 DIAGNOSIS — Z6834 Body mass index (BMI) 34.0-34.9, adult: Secondary | ICD-10-CM | POA: Diagnosis not present

## 2022-08-21 DIAGNOSIS — K760 Fatty (change of) liver, not elsewhere classified: Secondary | ICD-10-CM | POA: Diagnosis not present

## 2022-08-21 DIAGNOSIS — F5101 Primary insomnia: Secondary | ICD-10-CM | POA: Diagnosis not present

## 2022-08-21 DIAGNOSIS — Z125 Encounter for screening for malignant neoplasm of prostate: Secondary | ICD-10-CM | POA: Diagnosis not present

## 2022-08-21 DIAGNOSIS — D518 Other vitamin B12 deficiency anemias: Secondary | ICD-10-CM | POA: Diagnosis not present

## 2022-08-21 DIAGNOSIS — G894 Chronic pain syndrome: Secondary | ICD-10-CM | POA: Diagnosis not present

## 2022-08-21 DIAGNOSIS — G9332 Myalgic encephalomyelitis/chronic fatigue syndrome: Secondary | ICD-10-CM | POA: Diagnosis not present

## 2022-08-21 DIAGNOSIS — Z0001 Encounter for general adult medical examination with abnormal findings: Secondary | ICD-10-CM | POA: Diagnosis not present

## 2022-08-21 DIAGNOSIS — Z9229 Personal history of other drug therapy: Secondary | ICD-10-CM | POA: Diagnosis not present

## 2023-02-03 NOTE — Progress Notes (Signed)
No show

## 2023-11-17 DIAGNOSIS — Z0001 Encounter for general adult medical examination with abnormal findings: Secondary | ICD-10-CM | POA: Diagnosis not present

## 2023-11-17 DIAGNOSIS — Z1331 Encounter for screening for depression: Secondary | ICD-10-CM | POA: Diagnosis not present

## 2023-11-17 DIAGNOSIS — E039 Hypothyroidism, unspecified: Secondary | ICD-10-CM | POA: Diagnosis not present

## 2023-11-17 DIAGNOSIS — Z6832 Body mass index (BMI) 32.0-32.9, adult: Secondary | ICD-10-CM | POA: Diagnosis not present

## 2023-11-17 DIAGNOSIS — D518 Other vitamin B12 deficiency anemias: Secondary | ICD-10-CM | POA: Diagnosis not present

## 2023-11-17 DIAGNOSIS — E559 Vitamin D deficiency, unspecified: Secondary | ICD-10-CM | POA: Diagnosis not present

## 2023-11-17 DIAGNOSIS — Z9229 Personal history of other drug therapy: Secondary | ICD-10-CM | POA: Diagnosis not present

## 2023-11-17 DIAGNOSIS — E6609 Other obesity due to excess calories: Secondary | ICD-10-CM | POA: Diagnosis not present

## 2023-11-17 DIAGNOSIS — E785 Hyperlipidemia, unspecified: Secondary | ICD-10-CM | POA: Diagnosis not present

## 2023-11-17 DIAGNOSIS — R7309 Other abnormal glucose: Secondary | ICD-10-CM | POA: Diagnosis not present

## 2023-11-17 DIAGNOSIS — Z125 Encounter for screening for malignant neoplasm of prostate: Secondary | ICD-10-CM | POA: Diagnosis not present

## 2023-11-17 DIAGNOSIS — G9332 Myalgic encephalomyelitis/chronic fatigue syndrome: Secondary | ICD-10-CM | POA: Diagnosis not present
